# Patient Record
Sex: Male | Born: 1995 | Race: Black or African American | Hispanic: No | Marital: Single | Smoking: Never smoker
Health system: Southern US, Community
[De-identification: ages and names within clinical notes are randomized; demographics above are authoritative.]

## PROBLEM LIST (undated history)

## (undated) ENCOUNTER — Emergency Department (HOSPITAL_COMMUNITY): Admission: EM | Payer: Self-pay | Source: Home / Self Care

## (undated) DIAGNOSIS — A64 Unspecified sexually transmitted disease: Secondary | ICD-10-CM

---
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
## 2014-07-15 ENCOUNTER — Ambulatory Visit (INDEPENDENT_AMBULATORY_CARE_PROVIDER_SITE_OTHER): Payer: BLUE CROSS/BLUE SHIELD | Admitting: Family Medicine

## 2014-07-15 VITALS — BP 120/78 | HR 70 | Temp 98.7°F | Resp 12 | Ht 70.2 in | Wt 136.0 lb

## 2014-07-15 DIAGNOSIS — L209 Atopic dermatitis, unspecified: Secondary | ICD-10-CM

## 2014-07-15 MED ORDER — BETAMETHASONE DIPROPIONATE 0.05 % EX CREA: TOPICAL_CREAM | Freq: Every day | CUTANEOUS | Status: DC

## 2014-07-15 MED ORDER — HYDROXYZINE HCL 25 MG PO TABS: 25.0000 mg | ORAL_TABLET | ORAL | Status: DC | PRN

## 2014-07-15 MED ORDER — TRIAMCINOLONE ACETONIDE 0.1 % EX CREA: 1.0000 "application " | TOPICAL_CREAM | Freq: Two times a day (BID) | CUTANEOUS | Status: DC

## 2014-07-15 NOTE — Progress Notes (Signed)
Subjective:    Patient ID: Derek Warren, male    DOB: Aug 30, 1995, 19 y.o.   MRN: [id number] This chart was scribed for Delman Cheadle, MD by Marti Sleigh, Medical Scribe. This patient was seen in Room 11 and the patient's care was started a 8:44 AM.  Chief Complaint  Patient presents with  . Skin Problem    Tatoo has given problems, started about one month ago w/some itching, pain    HPI HPI Comments: Derek Warren is a 19 y.o. male who presents to [facility] complaining of a rash on his right arm for the last month. Pt states he has reported to [facility] previously for the same sx and was prescribed a steroid cream with relief of his sx. Pt denies having rashes when he was a baby.   History reviewed. No pertinent past medical history. No current outpatient prescriptions on file prior to visit.   No current facility-administered medications on file prior to visit.   No Known Allergies   Review of Systems  Constitutional: Negative for fever, chills, diaphoresis, activity change, appetite change, fatigue and unexpected weight change.  HENT: Negative for rhinorrhea and sneezing.   Respiratory: Negative for cough, chest tightness, shortness of breath and wheezing.   Cardiovascular: Negative for chest pain, palpitations and leg swelling.  Endocrine: Negative for cold intolerance, heat intolerance, polydipsia, polyphagia and polyuria.  Musculoskeletal: Negative for myalgias, arthralgias and gait problem.  Skin: Positive for color change and rash. Negative for pallor and wound.  Allergic/Immunologic: Negative for environmental allergies, food allergies and immunocompromised state.  Hematological: Negative for adenopathy. Does not bruise/bleed easily.  Psychiatric/Behavioral: Positive for sleep disturbance. The patient is nervous/anxious.        Objective:   Physical Exam  Constitutional: He is oriented to person, place, and time. He appears well-developed and well-nourished. No distress.  HENT:    Head: Normocephalic and atraumatic.  Eyes: Pupils are equal, round, and reactive to light.  Neck: Neck supple.  Cardiovascular: Normal rate.   Pulmonary/Chest: Effort normal. No respiratory distress.  Musculoskeletal: Normal range of motion.  Neurological: He is alert and oriented to person, place, and time. Coordination normal.  Skin: Skin is warm and dry. He is not diaphoretic.  Rash with normal skin pigmentaiton forming very faint thin macular striae across flexor surfaces of bilateral elbow with faint surrounding lichenification. Without any induration, fluctuance,scale, tenderness, papular, or erythemic appearance.  Psychiatric: He has a normal mood and affect. His behavior is normal.  Nursing note and vitals reviewed.  BP 120/78 mmHg  Pulse 70  Temp(Src) 98.7 F (37.1 C) (Oral)  Resp 12  Ht 5' 10.2" (1.783 m)  Wt 136 lb (61.689 kg)  BMI 19.40 kg/m2  SpO2 99%     Assessment & Plan:   1. Atopic dermatitis   Pt offered labs for esr/crp/cbc/ana as derm c/s Weber obtained at his last visit noted that this could potentially be a harbinger of lupus or sarcoid. Pt declines this, biopsy, and derm referral for now but will RTC for f/u in about 1 mo w/ me so we proceed if he is continuing to have symptoms. His rash has improved HUGELY from prior - but he is worried that it will progress so returned early - see AVS for detailed instructions.  Spent a long time discussing how to use the creams, the potential adverse reactions and side effects and that we do are not going to be able to predict how his skin pigment and tatoo ink  will respond to the topicals but if any adverse effect d/c immed.  Spent sig time discussing management of this chronic rash over the long term to prevent recurrent flairs.  Meds ordered this encounter  Medications  . triamcinolone cream (KENALOG) 0.1 %    Sig: Apply 1 application topically 2 (two) times daily.    Dispense:  453 g    Refill:  0    Mix in a 1:2  ratio of triamcinolone with a thick white hypoallergenic lotion such as Eucerin/Aquaphor/Cedaphil.  . betamethasone dipropionate (DIPROLENE) 0.05 % cream    Sig: Apply topically daily.    Dispense:  45 g    Refill:  0  . DISCONTD: hydrOXYzine (ATARAX/VISTARIL) 25 MG tablet    Sig: Take 1-2 tablets (25-50 mg total) by mouth every 4 (four) hours as needed for itching (and sleep - will cause drowsiness so use mainly at night).    Dispense:  90 tablet    Refill:  0  . hydrOXYzine (ATARAX/VISTARIL) 25 MG tablet    Sig: Take 1-2 tablets (25-50 mg total) by mouth every 4 (four) hours as needed for itching (and sleep - will cause drowsiness so use for sleep).    Dispense:  90 tablet    Refill:  0   Over 25 min spent in face-to-face evaluation of and consultation with patient and coordination of care.  Over 50% of this time was spent counseling this patient.   I personally performed the services described in this documentation, which was scribed in my presence. The recorded information has been reviewed and considered, and addended by me as needed.  Delman Cheadle, MD MPH

## 2014-07-15 NOTE — Patient Instructions (Signed)
Apply the triamcinolone compound cream to your entire right and left arm immediately after showering once daily (if you don't shower every day just apply it at the same time once a day - less showering is actually better for your skin and remember to use cool water - more frequent showing and hot water will likely make this worse as it dries out the skin so often only showering every 2-3d if you are not getting sweaty or dirty is going to be the best for skin. While the rash is still itching and bothering you, apply the betamethasone into your flexor surfaces of your elbows until those rashes are completely gone.  Hopefully in another several weeks to several months when you are not having any further rashes or problems, you can stop all the prescription steroid creams and switch over to good hypoallergenic thick moisturizer cream such as Eucerin, Cedaphil, or Aquaphor and apply this continually twice a day - especially immediately after showering to prevent it from coming back - and will likely need to do this for the rest of your life.  When everything is gone and you are stopping the creams, here are so other ways that you can hopefully use to ensure that the rash does not come back.  General measures for eczema - In clinical experience, avoidance of irritants or exacerbating factors is beneficial for most patients with dyshidrotic eczema. General skin care measures aimed at reducing skin irritation and restoring the skin barrier include:  Using lukewarm water and soap-free cleansers to wash  Drying skin thoroughly after washing  Applying emollients (eg, petroleum jelly, cocoa butter) immediately after drying and as often as possible  Wearing cotton clothes that breath - especially whever you are getting wet or sweaty  Minimize exposure to metal (jewelry) and any new tatoos/ink until completely resolved Wearing protective barrier clothes in cold weather  Wearing task-specific clothes for frictional  exposures (eg, gardening, carpentry)  Avoiding exposure to irritants (eg, detergents, solvents, hair lotions or dyes, acidic foods [eg, citrus fruit])  Do not use any products that dry your skin.   Eczema Eczema, also called atopic dermatitis, is a skin disorder that causes inflammation of the skin. It causes a red rash and dry, scaly skin. The skin becomes very itchy. Eczema is generally worse during the cooler winter months and often improves with the warmth of summer. Eczema usually starts showing signs in infancy. Some children outgrow eczema, but it may last through adulthood.  CAUSES  The exact cause of eczema is not known, but it appears to run in families. People with eczema often have a family history of eczema, allergies, asthma, or hay fever. Eczema is not contagious. Flare-ups of the condition may be caused by:   Contact with something you are sensitive or allergic to.   Stress. SIGNS AND SYMPTOMS  Dry, scaly skin.   Red, itchy rash.   Itchiness. This may occur before the skin rash and may be very intense.  DIAGNOSIS  The diagnosis of eczema is usually made based on symptoms and medical history. TREATMENT  Eczema cannot be cured, but symptoms usually can be controlled with treatment and other strategies. A treatment plan might include:  Controlling the itching and scratching.   Use over-the-counter antihistamines as directed for itching. This is especially useful at night when the itching tends to be worse.   Use over-the-counter steroid creams as directed for itching.   Avoid scratching. Scratching makes the rash and itching worse. It may  also result in a skin infection (impetigo) due to a break in the skin caused by scratching.   Keeping the skin well moisturized with creams every day. This will seal in moisture and help prevent dryness. Lotions that contain alcohol and water should be avoided because they can dry the skin.   Limiting exposure to things that  you are sensitive or allergic to (allergens).   Recognizing situations that cause stress.   Developing a plan to manage stress.  HOME CARE INSTRUCTIONS   Only take over-the-counter or prescription medicines as directed by your health care provider.   Do not use anything on the skin without checking with your health care provider.   Keep baths or showers short (5 minutes) in warm (not hot) water. Use mild cleansers for bathing. These should be unscented. You may add nonperfumed bath oil to the bath water. It is best to avoid soap and bubble bath.   Immediately after a bath or shower, when the skin is still damp, apply a moisturizing ointment to the entire body. This ointment should be a petroleum ointment. This will seal in moisture and help prevent dryness. The thicker the ointment, the better. These should be unscented.   Keep fingernails cut short. Children with eczema may need to wear soft gloves or mittens at night after applying an ointment.   Dress in clothes made of cotton or cotton blends. Dress lightly, because heat increases itching.   A child with eczema should stay away from anyone with fever blisters or cold sores. The virus that causes fever blisters (herpes simplex) can cause a serious skin infection in children with eczema. SEEK MEDICAL CARE IF:   Your itching interferes with sleep.   Your rash gets worse or is not better within 1 week after starting treatment.   You see pus or soft yellow scabs in the rash area.   You have a fever.   You have a rash flare-up after contact with someone who has fever blisters.  Document Released: 02/15/2000 Document Revised: 12/08/2012 Document Reviewed: 09/20/2012 [facility] CenterExitCare Patient Information 2015 [employer], [place]. This information is not intended to replace advice given to you by your health care provider. Make sure you discuss any questions you have with your health care provider.

## 2014-08-23 ENCOUNTER — Other Ambulatory Visit: Payer: Self-pay | Admitting: Family Medicine

## 2014-08-25 ENCOUNTER — Ambulatory Visit: Payer: BLUE CROSS/BLUE SHIELD | Admitting: Family Medicine

## 2015-02-08 ENCOUNTER — Ambulatory Visit (INDEPENDENT_AMBULATORY_CARE_PROVIDER_SITE_OTHER): Payer: BLUE CROSS/BLUE SHIELD | Admitting: Emergency Medicine

## 2015-02-08 VITALS — BP 134/74 | HR 70 | Temp 98.3°F | Resp 16 | Ht 70.5 in | Wt 141.6 lb

## 2015-02-08 DIAGNOSIS — Z23 Encounter for immunization: Secondary | ICD-10-CM | POA: Diagnosis not present

## 2015-02-08 DIAGNOSIS — Z202 Contact with and (suspected) exposure to infections with a predominantly sexual mode of transmission: Secondary | ICD-10-CM

## 2015-02-08 NOTE — Patient Instructions (Signed)
Sexually Transmitted Disease °A sexually transmitted disease (STD) is a disease or infection that may be passed (transmitted) from person to person, usually during sexual activity. This may happen by way of saliva, semen, blood, vaginal mucus, or urine. Common STDs include: °· Gonorrhea. °· Chlamydia. °· Syphilis. °· HIV and AIDS. °· Genital herpes. °· Hepatitis B and C. °· Trichomonas. °· Human papillomavirus (HPV). °· Pubic lice. °· Scabies. °· Mites. °· Bacterial vaginosis. °WHAT ARE CAUSES OF STDs? °An STD may be caused by bacteria, a virus, or parasites. STDs are often transmitted during sexual activity if one person is infected. However, they may also be transmitted through nonsexual means. STDs may be transmitted after:  °· Sexual intercourse with an infected person. °· Sharing sex toys with an infected person. °· Sharing needles with an infected person or using unclean piercing or tattoo needles. °· Having intimate contact with the genitals, mouth, or rectal areas of an infected person. °· Exposure to infected fluids during birth. °WHAT ARE THE SIGNS AND SYMPTOMS OF STDs? °Different STDs have different symptoms. Some people may not have any symptoms. If symptoms are present, they may include: °· Painful or bloody urination. °· Pain in the pelvis, abdomen, vagina, anus, throat, or eyes. °· A skin rash, itching, or irritation. °· Growths, ulcerations, blisters, or sores in the genital and anal areas. °· Abnormal vaginal discharge with or without bad odor. °· Penile discharge in men. °· Fever. °· Pain or bleeding during sexual intercourse. °· Swollen glands in the groin area. °· Yellow skin and eyes (jaundice). This is seen with hepatitis. °· Swollen testicles. °· Infertility. °· Sores and blisters in the mouth. °HOW ARE STDs DIAGNOSED? °To make a diagnosis, your health care provider may: °· Take a medical history. °· Perform a physical exam. °· Take a sample of any discharge to examine. °· Swab the throat,  cervix, opening to the penis, rectum, or vagina for testing. °· Test a sample of your first morning urine. °· Perform blood tests. °· Perform a Pap test, if this applies. °· Perform a colposcopy. °· Perform a laparoscopy. °HOW ARE STDs TREATED? °Treatment depends on the STD. Some STDs may be treated but not cured. °· Chlamydia, gonorrhea, trichomonas, and syphilis can be cured with antibiotic medicine. °· Genital herpes, hepatitis, and HIV can be treated, but not cured, with prescribed medicines. The medicines lessen symptoms. °· Genital warts from HPV can be treated with medicine or by freezing, burning (electrocautery), or surgery. Warts may come back. °· HPV cannot be cured with medicine or surgery. However, abnormal areas may be removed from the cervix, vagina, or vulva. °· If your diagnosis is confirmed, your recent sexual partners need treatment. This is true even if they are symptom-free or have a negative culture or evaluation. They should not have sex until their health care providers say it is okay. °· Your health care provider may test you for infection again 3 months after treatment. °HOW CAN I REDUCE MY RISK OF GETTING AN STD? °Take these steps to reduce your risk of getting an STD: °· Use latex condoms, dental dams, and water-soluble lubricants during sexual activity. Do not use petroleum jelly or oils. °· Avoid having multiple sex partners. °· Do not have sex with someone who has other sex partners °· Do not have sex with anyone you do not know or who is at high risk for an STD. °· Avoid risky sex practices that can break your skin. °· Do not have sex   if you have open sores on your mouth or skin. °· Avoid drinking too much alcohol or taking illegal drugs. Alcohol and drugs can affect your judgment and put you in a vulnerable position. °· Avoid engaging in oral and anal sex acts. °· Get vaccinated for HPV and hepatitis. If you have not received these vaccines in the past, talk to your health care  provider about whether one or both might be right for you. °· If you are at risk of being infected with HIV, it is recommended that you take a prescription medicine daily to prevent HIV infection. This is called pre-exposure prophylaxis (PrEP). You are considered at risk if: °¨ You are a man who has sex with other men (MSM). °¨ You are a heterosexual man or woman and are sexually active with more than one partner. °¨ You take drugs by injection. °¨ You are sexually active with a partner who has HIV. °· Talk with your health care provider about whether you are at high risk of being infected with HIV. If you choose to begin PrEP, you should first be tested for HIV. You should then be tested every 3 months for as long as you are taking PrEP. °WHAT SHOULD I DO IF I THINK I HAVE AN STD? °· See your health care provider. °· Tell your sexual partner(s). They should be tested and treated for any STDs. °· Do not have sex until your health care provider says it is okay. °WHEN SHOULD I GET IMMEDIATE MEDICAL CARE? °Contact your health care provider right away if:  °· You have severe abdominal pain. °· You are a man and notice swelling or pain in your testicles. °· You are a woman and notice swelling or pain in your vagina. °  °This information is not intended to replace advice given to you by your health care provider. Make sure you discuss any questions you have with your health care provider. °  °Document Released: 05/10/2002 Document Revised: 03/10/2014 Document Reviewed: 09/07/2012 °Elsevier Interactive Patient Education ©2016 [employer] ° °

## 2015-02-08 NOTE — Progress Notes (Signed)
Subjective:  Patient ID: Derek Warren, male    DOB: September 19, 1995  Age: 19 y.o. MRN: [id number]  CC: Std check and Immunizations   HPI Derek Warren presents   Patient is concerned the baby has been exposed to STD has a history of prior STDs he's not symptomatic currently has no discharge or rash blisters. He denies any pain. He requests a flu shot  History Derek Warren has no past medical history on file.   He has no past surgical history on file.   His  family history includes Cancer in his other.  He   reports that he has never smoked. He has never used smokeless tobacco. He reports that he does not drink alcohol or use illicit drugs.  Outpatient Prescriptions Prior to Visit  Medication Sig Dispense Refill  . betamethasone dipropionate (DIPROLENE) 0.05 % cream Apply topically daily. (Patient not taking: Reported on 02/08/2015) 45 g 0  . hydrOXYzine (ATARAX/VISTARIL) 25 MG tablet Take 1-2 tablets (25-50 mg total) by mouth every 4 (four) hours as needed for itching (and sleep - will cause drowsiness so use for sleep). (Patient not taking: Reported on 02/08/2015) 90 tablet 0  . triamcinolone cream (KENALOG) 0.1 % Apply 1 application topically 2 (two) times daily. (Patient not taking: Reported on 02/08/2015) 453 g 0   No facility-administered medications prior to visit.    Social History   Social History  . Marital Status: Single    Spouse Name: N/A  . Number of Children: N/A  . Years of Education: N/A   Social History Main Topics  . Smoking status: Never Smoker   . Smokeless tobacco: Never Used  . Alcohol Use: No  . Drug Use: No  . Sexual Activity: Not Asked   Other Topics Concern  . None   Social History Narrative     Review of Systems  Constitutional: Negative for fever, chills and appetite change.  HENT: Negative for congestion, ear pain, postnasal drip, sinus pressure and sore throat.   Eyes: Negative for pain and redness.  Respiratory: Negative for cough,  shortness of breath and wheezing.   Cardiovascular: Negative for leg swelling.  Gastrointestinal: Negative for nausea, vomiting, abdominal pain, diarrhea, constipation and blood in stool.  Endocrine: Negative for polyuria.  Genitourinary: Negative for dysuria, urgency, frequency and flank pain.  Musculoskeletal: Negative for gait problem.  Skin: Negative for rash.  Neurological: Negative for weakness and headaches.  Psychiatric/Behavioral: Negative for confusion and decreased concentration. The patient is not nervous/anxious.     Objective:  BP 134/74 mmHg  Pulse 70  Temp(Src) 98.3 F (36.8 C) (Oral)  Resp 16  Ht 5' 10.5" (1.791 m)  Wt 141 lb 9.6 oz (64.229 kg)  BMI 20.02 kg/m2  SpO2 99%  Physical Exam  Constitutional: He is oriented to person, place, and time. He appears well-developed and well-nourished. No distress.  HENT:  Head: Normocephalic and atraumatic.  Right Ear: External ear normal.  Left Ear: External ear normal.  Nose: Nose normal.  Eyes: Conjunctivae and EOM are normal. Pupils are equal, round, and reactive to light. No scleral icterus.  Neck: Normal range of motion. Neck supple. No tracheal deviation present.  Cardiovascular: Normal rate, regular rhythm and normal heart sounds.   Pulmonary/Chest: Effort normal. No respiratory distress. He has no wheezes. He has no rales.  Abdominal: He exhibits no mass. There is no tenderness. There is no rebound and no guarding.  Musculoskeletal: He exhibits no edema.  Lymphadenopathy:    He has  no cervical adenopathy.  Neurological: He is alert and oriented to person, place, and time. Coordination normal.  Skin: Skin is warm and dry. No rash noted.  Psychiatric: He has a normal mood and affect. His behavior is normal.      Assessment & Plan:   Derek Warren was seen today for std check and immunizations.  Diagnoses and all orders for this visit:  STD exposure -     Cancel: Epstein-Barr virus VCA antibody panel -      HSV(herpes simplex vrs) 1+2 ab-IgG -     HIV antibody -     RPR -     GC/Chlamydia Probe Amp  Needs flu shot -     Flu Vaccine QUAD 36+ mos IM   I am having Mr. Ladona RidgelRatliff maintain his triamcinolone cream, betamethasone dipropionate, and hydrOXYzine.  No orders of the defined types were placed in this encounter.    Appropriate red flag conditions were discussed with the patient as well as actions that should be taken.  Patient expressed his understanding.  Follow-up: Return if symptoms worsen or fail to improve.  Carmelina DaneAnderson, Breonna Gafford S, MD

## 2015-02-09 LAB — GC/CHLAMYDIA PROBE AMP
CT Probe RNA: NOT DETECTED
GC Probe RNA: NOT DETECTED

## 2015-02-09 LAB — HSV(HERPES SIMPLEX VRS) I + II AB-IGG
HSV 1 GLYCOPROTEIN G AB, IGG: 9.51 IV — AB
HSV 2 Glycoprotein G Ab, IgG: <0.1 IV

## 2015-02-09 LAB — HIV ANTIBODY (ROUTINE TESTING W REFLEX): HIV: NONREACTIVE

## 2015-02-09 LAB — RPR

## 2015-03-14 ENCOUNTER — Ambulatory Visit: Payer: BLUE CROSS/BLUE SHIELD

## 2015-03-28 ENCOUNTER — Ambulatory Visit: Payer: BLUE CROSS/BLUE SHIELD

## 2015-03-29 ENCOUNTER — Ambulatory Visit (INDEPENDENT_AMBULATORY_CARE_PROVIDER_SITE_OTHER): Payer: BC Managed Care – PPO | Admitting: Urgent Care

## 2015-03-29 VITALS — BP 118/68 | HR 82 | Temp 98.9°F | Ht 70.5 in | Wt 140.0 lb

## 2015-03-29 DIAGNOSIS — L309 Dermatitis, unspecified: Secondary | ICD-10-CM | POA: Diagnosis not present

## 2015-03-29 DIAGNOSIS — Z7189 Other specified counseling: Secondary | ICD-10-CM | POA: Diagnosis not present

## 2015-03-29 DIAGNOSIS — Z202 Contact with and (suspected) exposure to infections with a predominantly sexual mode of transmission: Secondary | ICD-10-CM | POA: Diagnosis not present

## 2015-03-29 DIAGNOSIS — Z712 Person consulting for explanation of examination or test findings: Secondary | ICD-10-CM

## 2015-03-29 NOTE — Patient Instructions (Addendum)
- Use Dove for sensitive skin for bathing - Use Lubriderm for moisturizing your skin daily - If you get eczema again, use betamethasone (Diprolene) twice daily for 1 week with Zyrtec, hydroxyzine for itching.   Eczema Eczema, also called atopic dermatitis, is a skin disorder that causes inflammation of the skin. It causes a red rash and dry, scaly skin. The skin becomes very itchy. Eczema is generally worse during the cooler winter months and often improves with the warmth of summer. Eczema usually starts showing signs in infancy. Some children outgrow eczema, but it may last through adulthood.  CAUSES  The exact cause of eczema is not known, but it appears to run in families. People with eczema often have a family history of eczema, allergies, asthma, or hay fever. Eczema is not contagious. Flare-ups of the condition may be caused by:   Contact with something you are sensitive or allergic to.   Stress. SIGNS AND SYMPTOMS  Dry, scaly skin.   Red, itchy rash.   Itchiness. This may occur before the skin rash and may be very intense.  DIAGNOSIS  The diagnosis of eczema is usually made based on symptoms and medical history. TREATMENT  Eczema cannot be cured, but symptoms usually can be controlled with treatment and other strategies. A treatment plan might include:  Controlling the itching and scratching.   Use over-the-counter antihistamines as directed for itching. This is especially useful at night when the itching tends to be worse.   Use over-the-counter steroid creams as directed for itching.   Avoid scratching. Scratching makes the rash and itching worse. It may also result in a skin infection (impetigo) due to a break in the skin caused by scratching.   Keeping the skin well moisturized with creams every day. This will seal in moisture and help prevent dryness. Lotions that contain alcohol and water should be avoided because they can dry the skin.   Limiting exposure  to things that you are sensitive or allergic to (allergens).   Recognizing situations that cause stress.   Developing a plan to manage stress.  HOME CARE INSTRUCTIONS   Only take over-the-counter or prescription medicines as directed by your health care provider.   Do not use anything on the skin without checking with your health care provider.   Keep baths or showers short (5 minutes) in warm (not hot) water. Use mild cleansers for bathing. These should be unscented. You may add nonperfumed bath oil to the bath water. It is best to avoid soap and bubble bath.   Immediately after a bath or shower, when the skin is still damp, apply a moisturizing ointment to the entire body. This ointment should be a petroleum ointment. This will seal in moisture and help prevent dryness. The thicker the ointment, the better. These should be unscented.   Keep fingernails cut short. Children with eczema may need to wear soft gloves or mittens at night after applying an ointment.   Dress in clothes made of cotton or cotton blends. Dress lightly, because heat increases itching.   A child with eczema should stay away from anyone with fever blisters or cold sores. The virus that causes fever blisters (herpes simplex) can cause a serious skin infection in children with eczema. SEEK MEDICAL CARE IF:   Your itching interferes with sleep.   Your rash gets worse or is not better within 1 week after starting treatment.   You see pus or soft yellow scabs in the rash area.  You have a fever.   You have a rash flare-up after contact with someone who has fever blisters.    This information is not intended to replace advice given to you by your health care provider. Make sure you discuss any questions you have with your health care provider.   Document Released: 02/15/2000 Document Revised: 12/08/2012 Document Reviewed: 09/20/2012 Elsevier Interactive Patient Education 2016 [employer].   Safe  Sex Safe sex is about reducing the risk of giving or getting a sexually transmitted disease (STD). STDs are spread through sexual contact involving the genitals, mouth, or rectum. Some STDs can be cured and others cannot. Safe sex can also prevent unintended pregnancies.  WHAT ARE SOME SAFE SEX PRACTICES?  Limit your sexual activity to only one partner who is having sex with only you.  Talk to your partner about his or her past partners, past STDs, and drug use.  Use a condom every time you have sexual intercourse. This includes vaginal, oral, and anal sexual activity. Both females and males should wear condoms during oral sex. Only use latex or polyurethane condoms and water-based lubricants. Using petroleum-based lubricants or oils to lubricate a condom will weaken the condom and increase the chance that it will break. The condom should be in place from the beginning to the end of sexual activity. Wearing a condom reduces, but does not completely eliminate, your risk of getting or giving an STD. STDs can be spread by contact with infected body fluids and skin.  Get vaccinated for hepatitis B and HPV.  Avoid alcohol and recreational drugs, which can affect your judgment. You may forget to use a condom or participate in high-risk sex.  For females, avoid douching after sexual intercourse. Douching can spread an infection farther into the reproductive tract.  Check your body for signs of sores, blisters, rashes, or unusual discharge. See your health care provider if you notice any of these signs.  Avoid sexual contact if you have symptoms of an infection or are being treated for an STD. If you or your partner has herpes, avoid sexual contact when blisters are present. Use condoms at all other times.  If you are at risk of being infected with HIV, it is recommended that you take a prescription medicine daily to prevent HIV infection. This is called pre-exposure prophylaxis (PrEP). You are  considered at risk if:  You are a man who has sex with other men (MSM).  You are a heterosexual man or woman who is sexually active with more than one partner.  You take drugs by injection.  You are sexually active with a partner who has HIV.  Talk with your health care provider about whether you are at high risk of being infected with HIV. If you choose to begin PrEP, you should first be tested for HIV. You should then be tested every 3 months for as long as you are taking PrEP.  See your health care provider for regular screenings, exams, and tests for other STDs. Before having sex with a new partner, each of you should be screened for STDs and should talk about the results with each other. WHAT ARE THE BENEFITS OF SAFE SEX?   There is less chance of getting or giving an STD.  You can prevent unwanted or unintended pregnancies.  By discussing safe sex concerns with your partner, you may increase feelings of intimacy, comfort, trust, and honesty between the two of you.   This information is not intended to  replace advice given to you by your health care provider. Make sure you discuss any questions you have with your health care provider.   Document Released: 03/27/2004 Document Revised: 03/10/2014 Document Reviewed: 08/11/2011 Elsevier Interactive Patient Education [employer].

## 2015-03-29 NOTE — Progress Notes (Signed)
    MRN: [id number] DOB: Nov 17, 1995  Subjective:   Derek Warren is a 20 y.o. male presenting for chief complaint of Dysuria and Follow-up  STI exposure - patient was worried that he had gonorrhea and chlamydia from a bill he received for this testing. He denies dysuria, penile discharge, genital rash. He never received results letter due to incorrect address.  Atopic Dermatitis - has a history of eczema which is improved with steroid cream. Patient is worried that this is still an ongoing problem. He denies rash, redness or itchiness.  Derek Warren has a current medication list which includes the following prescription(s): betamethasone dipropionate. Also has No Known Allergies.  Derek Warren  has no past medical history on file. Also  has no past surgical history on file.  Objective:   Vitals: BP 118/68 mmHg  Pulse 82  Temp(Src) 98.9 F (37.2 C) (Oral)  Ht 5' 10.5" (1.791 m)  Wt 140 lb (63.504 kg)  BMI 19.80 kg/m2  SpO2 98%  Physical Exam  Constitutional: He is oriented to person, place, and time. He appears well-developed and well-nourished.  Cardiovascular: Normal rate.   Pulmonary/Chest: Effort normal.  Neurological: He is alert and oriented to person, place, and time.  Skin: Skin is warm and dry. No rash noted. No erythema. No pallor.  Patient has large tattoo covering 50-75% of right forearm.   Assessment and Plan :   1. Encounter to discuss test results 2. STD exposure - Reviewed results, recommended safe sex practices.  3. Eczema - Counseled on diagnosis, recommended daily moisturizing. Use Diprolene if eczema returns, also use oral antihistamine.  Wallis Bamberg, PA-C [facility] [phone] 03/29/2015 8:32 AM

## 2018-03-03 ENCOUNTER — Emergency Department (HOSPITAL_COMMUNITY)
Admission: EM | Admit: 2018-03-03 | Discharge: 2018-03-03 | Disposition: A | Discharge status: Home / Self Care | Payer: BC Managed Care – PPO | Attending: Emergency Medicine | Admitting: Emergency Medicine

## 2018-03-03 ENCOUNTER — Encounter (HOSPITAL_COMMUNITY): Payer: Self-pay | Admitting: Emergency Medicine

## 2018-03-03 ENCOUNTER — Other Ambulatory Visit: Payer: Self-pay

## 2018-03-03 DIAGNOSIS — R1110 Vomiting, unspecified: Secondary | ICD-10-CM | POA: Diagnosis not present

## 2018-03-03 DIAGNOSIS — D72819 Decreased white blood cell count, unspecified: Secondary | ICD-10-CM | POA: Diagnosis not present

## 2018-03-03 DIAGNOSIS — R509 Fever, unspecified: Secondary | ICD-10-CM | POA: Diagnosis present

## 2018-03-03 LAB — CBC
HEMATOCRIT: 44 % (ref 39.0–52.0)
HEMOGLOBIN: 14.5 g/dL (ref 13.0–17.0)
MCH: 32.7 pg (ref 26.0–34.0)
MCHC: 33 g/dL (ref 30.0–36.0)
MCV: 99.1 fL (ref 80.0–100.0)
NRBC: 0 % (ref 0.0–0.2)
Platelets: 275 10*3/uL (ref 150–400)
RBC: 4.44 MIL/uL (ref 4.22–5.81)
RDW: 11.6 % (ref 11.5–15.5)
WBC: 3.9 10*3/uL — AB (ref 4.0–10.5)

## 2018-03-03 LAB — COMPREHENSIVE METABOLIC PANEL
ALT: 6 U/L (ref 0–44)
AST: 24 U/L (ref 15–41)
Albumin: 4.3 g/dL (ref 3.5–5.0)
Alkaline Phosphatase: 60 U/L (ref 38–126)
Anion gap: 12 (ref 5–15)
BUN: 10 mg/dL (ref 6–20)
CO2: 24 mmol/L (ref 22–32)
Calcium: 9.2 mg/dL (ref 8.9–10.3)
Chloride: 101 mmol/L (ref 98–111)
Creatinine, Ser: 0.98 mg/dL (ref 0.61–1.24)
GFR calc Af Amer: >60 mL/min (ref >60)
GFR calc non Af Amer: >60 mL/min (ref >60)
Glucose, Bld: 103 mg/dL — ABNORMAL HIGH (ref 70–99)
POTASSIUM: 3.7 mmol/L (ref 3.5–5.1)
SODIUM: 137 mmol/L (ref 135–145)
Total Bilirubin: 0.9 mg/dL (ref 0.3–1.2)
Total Protein: 7.8 g/dL (ref 6.5–8.1)

## 2018-03-03 LAB — LIPASE, BLOOD: Lipase: 26 U/L (ref 11–51)

## 2018-03-03 MED ORDER — ONDANSETRON 4 MG PO TBDP: 4.0000 mg | ORAL_TABLET | Freq: Three times a day (TID) | ORAL | 0 refills | Status: DC | PRN

## 2018-03-03 NOTE — ED Provider Notes (Addendum)
[facility] EMERGENCY DEPARTMENT Provider Note   [name]: [id number] Arrival date & time: 03/03/18  1928     History   Chief Complaint Chief Complaint  Patient presents with  . Abdominal Pain    HPI Derek Warren is a 23 y.o. male.  23 yo M with a chief complaint of diffuse myalgias subjective fevers and chills and vomiting.  Going on for the past week or so.  Is vomited about 2 times in total.  Denies abdominal pain denies sick contacts.  Has had a mild cough as well.  The history is provided by the patient.  Abdominal Pain   Pertinent negatives include fever, diarrhea, vomiting, headaches, arthralgias and myalgias.  Illness  This is a new problem. The current episode started more than 1 week ago. The problem occurs constantly. The problem has been gradually improving. Associated symptoms include abdominal pain. Pertinent negatives include no chest pain, no headaches and no shortness of breath. Nothing aggravates the symptoms. Nothing relieves the symptoms. He has tried nothing for the symptoms. The treatment provided no relief.    History reviewed. No pertinent past medical history.  There are no active problems to display for this patient.   History reviewed. No pertinent surgical history.      Home Medications    Prior to Admission medications   Medication Sig Start Date End Date Taking? Authorizing Provider  betamethasone dipropionate (DIPROLENE) 0.05 % cream Apply 1 application topically 2 (two) times daily.    [provider]  ondansetron (ZOFRAN ODT) 4 MG disintegrating tablet Take 1 tablet (4 mg total) by mouth every 8 (eight) hours as needed for nausea or vomiting. 03/03/18   Melene Plan, DO    Family History Family History  Problem Relation Age of Onset  . Cancer Other     Social History Social History   Tobacco Use  . Smoking status: Never Smoker  . Smokeless tobacco: Never Used  Substance Use Topics  . Alcohol use: No   Alcohol/week: 0.0 standard drinks  . Drug use: No     Allergies   Patient has no known allergies.   Review of Systems Review of Systems  Constitutional: Negative for chills and fever.  HENT: Negative for congestion and facial swelling.   Eyes: Negative for discharge and visual disturbance.  Respiratory: Negative for shortness of breath.   Cardiovascular: Negative for chest pain and palpitations.  Gastrointestinal: Positive for abdominal pain. Negative for diarrhea and vomiting.  Musculoskeletal: Negative for arthralgias and myalgias.  Skin: Negative for color change and rash.  Neurological: Negative for tremors, syncope and headaches.  Psychiatric/Behavioral: Negative for confusion and dysphoric mood.     Physical Exam Updated Vital Signs BP 127/81   Pulse 62   Temp 98.2 F (36.8 C) (Oral)   Resp 16   Ht 5\' 11"  (1.803 m)   Wt 63.5 kg   SpO2 100%   BMI 19.53 kg/m   Physical Exam Vitals signs and nursing note reviewed.  Constitutional:      Appearance: He is well-developed.  HENT:     Head: Normocephalic and atraumatic.  Eyes:     Pupils: Pupils are equal, round, and reactive to light.  Neck:     Musculoskeletal: Normal range of motion and neck supple.     Vascular: No JVD.  Cardiovascular:     Rate and Rhythm: Normal rate and regular rhythm.     Heart sounds: No murmur. No friction rub. No gallop.  Pulmonary:     Effort: No respiratory distress.     Breath sounds: No wheezing.  Abdominal:     General: There is no distension.     Tenderness: There is abdominal tenderness (mild, diffuse). There is no guarding or rebound.  Musculoskeletal: Normal range of motion.  Skin:    Coloration: Skin is not pale.     Findings: No rash.  Neurological:     Mental Status: He is alert and oriented to person, place, and time.  Psychiatric:        Behavior: Behavior normal.      ED Treatments / Results  Labs (all labs ordered are listed, but only abnormal results  are displayed) Labs Reviewed  COMPREHENSIVE METABOLIC PANEL - Abnormal; Notable for the following components:      Result Value   Glucose, Bld 103 (*)    All other components within normal limits  CBC - Abnormal; Notable for the following components:   WBC 3.9 (*)    All other components within normal limits  LIPASE, BLOOD  URINALYSIS, ROUTINE W REFLEX MICROSCOPIC    EKG None  Radiology No results found.  Procedures Procedures (including critical care time)  Medications Ordered in ED Medications - No data to display   Initial Impression / Assessment and Plan / ED Course  I have reviewed the triage vital signs and the nursing notes.  Pertinent labs & imaging results that were available during my care of the patient were reviewed by me and considered in my medical decision making (see chart for details).     23 yo M with cough myalgias couple episodes of vomiting and subjective fevers and chills.  Is likely this is a viral syndrome.  He had lab work performed with no LFT elevation normal lipase very minimally elevated blood sugar at 103.  He does have leukopenia.  Most likely result of the viral illness, suggested he follow this up with his PCP.  9:41 PM:  I have discussed the diagnosis/risks/treatment options with the patient and family and believe the pt to be eligible for discharge home to follow-up with PCP. We also discussed returning to the ED immediately if new or worsening sx occur. We discussed the sx which are most concerning (e.g., sudden worsening pain, fever, inability to tolerate by mouth) that necessitate immediate return. Medications administered to the patient during their visit and any new prescriptions provided to the patient are listed below.  Medications given during this visit Medications - No data to display    The patient appears reasonably screen and/or stabilized for discharge and I doubt any other medical condition or other [facility] requiring further  screening, evaluation, or treatment in the ED at this time prior to discharge.    Final Clinical Impressions(s) / ED Diagnoses   Final diagnoses:  Vomiting in adult  Leukopenia, unspecified type    ED Discharge Orders         Ordered    ondansetron (ZOFRAN ODT) 4 MG disintegrating tablet  Every 8 hours PRN     03/03/18 2139           Melene PlanFloyd, Minka Knight, DO 03/03/18 2142    Melene PlanFloyd, Samin Milke, DO 03/15/18 1042

## 2018-03-03 NOTE — ED Triage Notes (Signed)
Pt reports abd pain, lightheadedness and generalized body aches for the last week. Pt reports N/V. Denies diarrhea. Pt reports taking otc medications like Tylenol and Mucinex with some relief.

## 2018-03-03 NOTE — ED Notes (Signed)
Patient verbalizes understanding of discharge instructions. Opportunity for questioning and answers were provided. Armband removed by staff, pt discharged from ED. Pt ambulatory to lobby with friend.  

## 2018-03-03 NOTE — Discharge Instructions (Signed)
Return for fever, abdominal pain, inability to eat or drink.

## 2018-07-21 ENCOUNTER — Other Ambulatory Visit: Payer: Self-pay

## 2018-07-21 ENCOUNTER — Emergency Department (HOSPITAL_COMMUNITY)
Admission: EM | Admit: 2018-07-21 | Discharge: 2018-07-21 | Disposition: A | Discharge status: Home / Self Care | Payer: BC Managed Care – PPO | Attending: Emergency Medicine | Admitting: Emergency Medicine

## 2018-07-21 ENCOUNTER — Encounter (HOSPITAL_COMMUNITY): Payer: Self-pay | Admitting: Emergency Medicine

## 2018-07-21 DIAGNOSIS — R350 Frequency of micturition: Secondary | ICD-10-CM | POA: Insufficient documentation

## 2018-07-21 LAB — CBC WITH DIFFERENTIAL/PLATELET
Abs Immature Granulocytes: 0.01 10*3/uL (ref 0.00–0.07)
Basophils Absolute: 0.1 10*3/uL (ref 0.0–0.1)
Basophils Relative: 1 %
Eosinophils Absolute: 0.1 10*3/uL (ref 0.0–0.5)
Eosinophils Relative: 2 %
HCT: 39.7 % (ref 39.0–52.0)
Hemoglobin: 13.5 g/dL (ref 13.0–17.0)
Immature Granulocytes: 0 %
Lymphocytes Relative: 55 %
Lymphs Abs: 2.9 10*3/uL (ref 0.7–4.0)
MCH: 34.1 pg — ABNORMAL HIGH (ref 26.0–34.0)
MCHC: 34 g/dL (ref 30.0–36.0)
MCV: 100.3 fL — ABNORMAL HIGH (ref 80.0–100.0)
Monocytes Absolute: 0.5 10*3/uL (ref 0.1–1.0)
Monocytes Relative: 9 %
Neutro Abs: 1.8 10*3/uL (ref 1.7–7.7)
Neutrophils Relative %: 33 %
Platelets: 279 10*3/uL (ref 150–400)
RBC: 3.96 MIL/uL — ABNORMAL LOW (ref 4.22–5.81)
RDW: 11.6 % (ref 11.5–15.5)
WBC: 5.3 10*3/uL (ref 4.0–10.5)
nRBC: 0 % (ref 0.0–0.2)

## 2018-07-21 LAB — URINALYSIS, ROUTINE W REFLEX MICROSCOPIC
Bilirubin Urine: NEGATIVE
Glucose, UA: NEGATIVE mg/dL
Hgb urine dipstick: NEGATIVE
Ketones, ur: NEGATIVE mg/dL
Leukocytes,Ua: NEGATIVE
Nitrite: NEGATIVE
Protein, ur: NEGATIVE mg/dL
Specific Gravity, Urine: 1.027 (ref 1.005–1.030)
pH: 5 (ref 5.0–8.0)

## 2018-07-21 NOTE — ED Provider Notes (Signed)
[facility]   [name]: [id number] Arrival date & time: 07/21/18  2009    History   Chief Complaint Chief Complaint  Patient presents with  . Urinary Frequency    HPI Derek Warren is a 23 y.o. male.    23 year old male presents with urinary frequency.  Patient states he has urinary frequency because he drinks a lot of Gatorade.  He has here today requesting a work note to say he can go to the bathroom more than twice a shift.  Patient denies any other complaints, dysuria, abdominal pain, fevers.  Urinary Frequency  Pertinent negatives include no chest pain, no abdominal pain and no shortness of breath.    History reviewed. No pertinent past medical history.  There are no active problems to display for this patient.   History reviewed. No pertinent surgical history.      Home Medications    Prior to Admission medications   Medication Sig Start Date End Date Taking? Authorizing Provider  betamethasone dipropionate (DIPROLENE) 0.05 % cream Apply 1 application topically 2 (two) times daily.    [provider]  ondansetron (ZOFRAN ODT) 4 MG disintegrating tablet Take 1 tablet (4 mg total) by mouth every 8 (eight) hours as needed for nausea or vomiting. 03/03/18   Melene PlanFloyd, Dan, DO    Family History Family History  Problem Relation Age of Onset  . Cancer Other     Social History Social History   Tobacco Use  . Smoking status: Never Smoker  . Smokeless tobacco: Never Used  Substance Use Topics  . Alcohol use: No    Alcohol/week: 0.0 standard drinks  . Drug use: Never     Allergies   Patient has no known allergies.   Review of Systems Review of Systems  Constitutional: Negative for chills and fever.  Respiratory: Negative for shortness of breath.   Cardiovascular: Negative for chest pain.  Gastrointestinal: Negative for abdominal pain.  Genitourinary: Positive for frequency. Negative for discharge,  dysuria, penile pain and penile swelling.     Physical Exam Updated Vital Signs BP 122/73 (BP Location: Right Arm)   Pulse 76   Temp 98.6 F (37 C) (Oral)   Resp 16   Ht 5\' 11"  (1.803 m)   Wt 65.8 kg   SpO2 98%   BMI 20.22 kg/m   Physical Exam Vitals signs and nursing note reviewed.  Constitutional:      Appearance: He is well-developed.  HENT:     Head: Normocephalic and atraumatic.  Eyes:     Conjunctiva/sclera: Conjunctivae normal.  Neck:     Musculoskeletal: Neck supple.  Cardiovascular:     Rate and Rhythm: Normal rate and regular rhythm.     Heart sounds: Normal heart sounds. No murmur.  Pulmonary:     Effort: Pulmonary effort is normal. No respiratory distress.     Breath sounds: Normal breath sounds. No wheezing or rales.  Abdominal:     General: Bowel sounds are normal. There is no distension.     Palpations: Abdomen is soft.     Tenderness: There is no abdominal tenderness.  Musculoskeletal: Normal range of motion.        General: No tenderness or deformity.  Skin:    General: Skin is warm and dry.     Findings: No erythema or rash.  Neurological:     Mental Status: He is alert and oriented to person, place, and time.  Psychiatric:  Behavior: Behavior normal.      ED Treatments / Results  Labs (all labs ordered are listed, but only abnormal results are displayed) Labs Reviewed  CBC WITH DIFFERENTIAL/PLATELET - Abnormal; Notable for the following components:      Result Value   RBC 3.96 (*)    MCV 100.3 (*)    MCH 34.1 (*)    All other components within normal limits  URINALYSIS, ROUTINE W REFLEX MICROSCOPIC    EKG None  Radiology No results found.  Procedures Procedures (including critical care time)  Medications Ordered in ED Medications - No data to display   Initial Impression / Assessment and Plan / ED Course  I have reviewed the triage vital signs and the nursing notes.  Pertinent labs & imaging results that were  available during my care of the patient were reviewed by me and considered in my medical decision making (see chart for details).        Patient complaining of urinary frequency which he states is due to drinking a lot of Gatorade.  He is here today requesting a work note saying he may use bathroom more than twice in a shift.  His vital signs are stable.  His physical exam is unremarkable.  A CBC was ordered in triage however do not feel that this is indicated at this time.  CBC was unremarkable.  Urine shows no evidence of UTI.  No emergent medical condition identified at this time.  Patient ready and stable for discharge.  Final Clinical Impressions(s) / ED Diagnoses   Final diagnoses:  None    ED Discharge Orders    None       Rueben Bash 07/21/18 2220    Arby Barrette, MD 07/28/18 [phone]

## 2018-07-21 NOTE — ED Triage Notes (Signed)
Pt to ED for urinary frequency for 2 months

## 2018-07-21 NOTE — Discharge Instructions (Signed)
Continue to drink plenty of fluids.  Return to the ER for any new or worsening symptoms, such as fevers, abdominal pain, chest pain or any concerns at all.

## 2019-01-18 ENCOUNTER — Emergency Department (HOSPITAL_BASED_OUTPATIENT_CLINIC_OR_DEPARTMENT_OTHER)
Admission: EM | Admit: 2019-01-18 | Discharge: 2019-01-18 | Disposition: A | Discharge status: Home / Self Care | Payer: BC Managed Care – PPO | Attending: Emergency Medicine | Admitting: Emergency Medicine

## 2019-01-18 ENCOUNTER — Other Ambulatory Visit: Payer: Self-pay

## 2019-01-18 ENCOUNTER — Encounter (HOSPITAL_BASED_OUTPATIENT_CLINIC_OR_DEPARTMENT_OTHER): Payer: Self-pay | Admitting: *Deleted

## 2019-01-18 DIAGNOSIS — R369 Urethral discharge, unspecified: Secondary | ICD-10-CM | POA: Insufficient documentation

## 2019-01-18 DIAGNOSIS — Z202 Contact with and (suspected) exposure to infections with a predominantly sexual mode of transmission: Secondary | ICD-10-CM | POA: Insufficient documentation

## 2019-01-18 DIAGNOSIS — Z7689 Persons encountering health services in other specified circumstances: Secondary | ICD-10-CM

## 2019-01-18 MED ORDER — LIDOCAINE HCL (PF) 1 % IJ SOLN
INTRAMUSCULAR | Status: AC
  Administered 2019-01-18: 14:00:00 1.2 mL
  Filled 2019-01-18: qty 5

## 2019-01-18 MED ORDER — CEFTRIAXONE SODIUM 250 MG IJ SOLR
250.0000 mg | Freq: Once | INTRAMUSCULAR | Status: AC
  Administered 2019-01-18: 250 mg via INTRAMUSCULAR
  Filled 2019-01-18: qty 250

## 2019-01-18 MED ORDER — AZITHROMYCIN 250 MG PO TABS
1000.0000 mg | ORAL_TABLET | Freq: Once | ORAL | Status: AC
  Administered 2019-01-18: 1000 mg via ORAL
  Filled 2019-01-18: qty 4

## 2019-01-18 NOTE — ED Provider Notes (Signed)
[facility] EMERGENCY DEPARTMENT Provider Note   [name]: [id number] Arrival date & time: 01/18/19  1252     History   Chief Complaint No chief complaint on file.   HPI Derek Warren is a 23 y.o. male presenting for STD check.  Patient states in the past 1 to 2 months, he has started seeing a new male partner.  He sexually active with only this partner.  She is asymptomatic.  Patient states he is here to get tested for gonorrhea and chlamydia.  He does not want testing for HIV or syphilis.  He initially stated multiple times that he is here without symptoms, denies discharge, abdominal pain, nausea, vomiting, dysuria, hematuria, urinary frequency.  However he then states he is having clear discharge when asked why he wants to be treated.  He has no other medical problems, takes no medications daily. He does not use condoms.      HPI  History reviewed. No pertinent past medical history.  There are no active problems to display for this patient.   History reviewed. No pertinent surgical history.      Home Medications    Prior to Admission medications   Medication Sig Start Date End Date Taking? Authorizing Provider  betamethasone dipropionate (DIPROLENE) 0.05 % cream Apply 1 application topically 2 (two) times daily.    [provider]  ondansetron (ZOFRAN ODT) 4 MG disintegrating tablet Take 1 tablet (4 mg total) by mouth every 8 (eight) hours as needed for nausea or vomiting. 03/03/18   Deno Etienne, DO    Family History Family History  Problem Relation Age of Onset  . Cancer Other     Social History Social History   Tobacco Use  . Smoking status: Never Smoker  . Smokeless tobacco: Never Used  Substance Use Topics  . Alcohol use: No    Alcohol/week: 0.0 standard drinks  . Drug use: Never     Allergies   Patient has no known allergies.   Review of Systems Review of Systems  Constitutional: Negative for fever.  Genitourinary: Negative for  dysuria and hematuria.     Physical Exam Updated Vital Signs BP 125/76   Pulse 78   Temp 99.2 F (37.3 C) (Oral)   Resp 20   Ht 5\' 11"  (1.803 m)   Wt 65.8 kg   SpO2 100%   BMI 20.23 kg/m   Physical Exam Vitals signs and nursing note reviewed. Exam conducted with a chaperone present.  Constitutional:      General: He is not in acute distress.    Appearance: He is well-developed.  HENT:     Head: Normocephalic and atraumatic.  Neck:     Musculoskeletal: Normal range of motion.  Pulmonary:     Effort: Pulmonary effort is normal.  Abdominal:     General: There is no distension.     Palpations: There is no mass.     Tenderness: There is no abdominal tenderness. There is no guarding or rebound.  Genitourinary:    Penis: Circumcised.      Scrotum/Testes: Normal.     Epididymis:     Right: Normal.     Left: Normal.     Comments: No inguinal lymphadenopathy.  No swelling of the scrotum or penis.  No tenderness epididymitis.  No discharge noted.  No lesions noted. Musculoskeletal: Normal range of motion.  Lymphadenopathy:     Lower Body: No right inguinal adenopathy. No left inguinal adenopathy.  Skin:    General:  Skin is warm.     Findings: No rash.  Neurological:     Mental Status: He is alert and oriented to person, place, and time.      ED Treatments / Results  Labs (all labs ordered are listed, but only abnormal results are displayed) Labs Reviewed  GC/CHLAMYDIA PROBE AMP ([place]) NOT AT [facility]    EKG None  Radiology No results found.  Procedures Procedures (including critical care time)  Medications Ordered in ED Medications  cefTRIAXone (ROCEPHIN) injection 250 mg (has no administration in time range)  azithromycin (ZITHROMAX) tablet 1,000 mg (has no administration in time range)     Initial Impression / Assessment and Plan / ED Course  I have reviewed the triage vital signs and the nursing notes.  Pertinent labs & imaging results that were  available during my care of the patient were reviewed by me and considered in my medical decision making (see chart for details).        Patient presenting for STD evaluation.  Physical exam shows patient appears nontoxic.  It is unclear whether patient actually has symptoms, very distressed here for testing.  We do not have the penile swabs for STD testing, thus urine test was ordered.  Patient is aware that results are pending.  As patient is reporting discharge, will treat, though no discharge was found on exam.  Discussed importance of safe sex practices, and follow-up with health department for further needs.  At this time, patient appears safe for discharge.  Return precautions given.  Patient states he understands and agrees to plan.  Final Clinical Impressions(s) / ED Diagnoses   Final diagnoses:  Encounter for assessment of STD exposure  Penile discharge    ED Discharge Orders    None       Alveria Apley, PA-C 01/18/19 1410    Virgina Norfolk, DO 01/18/19 1431

## 2019-01-18 NOTE — Discharge Instructions (Addendum)
You were tested for gonorrhea and chlamydia today.  If results are positive, you will receive a phone call.  You will not need further treatment, however you will need to let your partner know when she will need to be treated.  You will have to abstain from sex to prevent reinfection. If the results are negative, you will not receive a phone call.  Either way, you may check online on MyChart. Follow-up with health department as needed for further concerns or STD testing. Return to the emergency room with any new, worsening, or concerning symptoms.

## 2019-01-18 NOTE — ED Triage Notes (Signed)
Pt states he has a penile discharge. No known STD exposure.

## 2019-01-19 ENCOUNTER — Other Ambulatory Visit: Payer: Self-pay

## 2019-01-19 DIAGNOSIS — Z20822 Contact with and (suspected) exposure to covid-19: Secondary | ICD-10-CM

## 2019-01-20 LAB — NOVEL CORONAVIRUS, NAA: SARS-CoV-2, NAA: NOT DETECTED

## 2019-01-20 LAB — GC/CHLAMYDIA PROBE AMP (~~LOC~~) NOT AT ARMC
Chlamydia: NEGATIVE
Neisseria Gonorrhea: NEGATIVE

## 2019-01-21 ENCOUNTER — Telehealth (HOSPITAL_COMMUNITY): Payer: Self-pay

## 2019-01-21 ENCOUNTER — Telehealth: Payer: Self-pay | Admitting: General Practice

## 2019-01-21 NOTE — Telephone Encounter (Signed)
Negative COVID results given. Patient results "NOT Detected." Caller expressed understanding. ° °

## 2019-04-18 ENCOUNTER — Other Ambulatory Visit: Payer: Self-pay

## 2019-04-18 ENCOUNTER — Emergency Department (HOSPITAL_BASED_OUTPATIENT_CLINIC_OR_DEPARTMENT_OTHER)
Admission: EM | Admit: 2019-04-18 | Discharge: 2019-04-18 | Disposition: A | Discharge status: Home / Self Care | Payer: BC Managed Care – PPO | Attending: Emergency Medicine | Admitting: Emergency Medicine

## 2019-04-18 ENCOUNTER — Encounter (HOSPITAL_BASED_OUTPATIENT_CLINIC_OR_DEPARTMENT_OTHER): Payer: Self-pay

## 2019-04-18 DIAGNOSIS — Z202 Contact with and (suspected) exposure to infections with a predominantly sexual mode of transmission: Secondary | ICD-10-CM | POA: Diagnosis present

## 2019-04-18 DIAGNOSIS — Z79899 Other long term (current) drug therapy: Secondary | ICD-10-CM | POA: Diagnosis not present

## 2019-04-18 LAB — RAPID HIV SCREEN (HIV 1/2 AB+AG)
HIV 1/2 Antibodies: NONREACTIVE
HIV-1 P24 Antigen - HIV24: NONREACTIVE

## 2019-04-18 MED ORDER — DOXYCYCLINE HYCLATE 100 MG PO CAPS: 100.0000 mg | ORAL_CAPSULE | Freq: Two times a day (BID) | ORAL | 0 refills | Status: DC

## 2019-04-18 MED ORDER — CEFTRIAXONE SODIUM 500 MG IJ SOLR
500.0000 mg | Freq: Once | INTRAMUSCULAR | Status: AC
  Administered 2019-04-18: 500 mg via INTRAMUSCULAR
  Filled 2019-04-18: qty 500

## 2019-04-18 NOTE — ED Triage Notes (Addendum)
Pt reports STD exposure-denies penile d/c-reports "bump" on penis-NAD-steady gait

## 2019-04-18 NOTE — ED Provider Notes (Signed)
[facility] EMERGENCY DEPARTMENT Provider Note   [name]: [id number] Arrival date & time: 04/18/19  1932     History Chief Complaint  Patient presents with  . Exposure to STD    Derek Warren is a 24 y.o. male.  HPI Patient reports he had a sexual contact 3 weeks ago that reportedly had an STD.  He reports he was told that he was exposed to an STD but not which one.  Patient denies that he has had any drainage from the penis.  He denies any pain or burning to urination.  No abdominal pain no testicular pain.  No fevers no chills no nausea no vomiting.    History reviewed. No pertinent past medical history.  There are no problems to display for this patient.   History reviewed. No pertinent surgical history.     Family History  Problem Relation Age of Onset  . Cancer Other     Social History   Tobacco Use  . Smoking status: Never Smoker  . Smokeless tobacco: Never Used  Substance Use Topics  . Alcohol use: Yes    Alcohol/week: 0.0 standard drinks    Comment: occ  . Drug use: Never    Home Medications Prior to Admission medications   Medication Sig Start Date End Date Taking? Authorizing Provider  betamethasone dipropionate (DIPROLENE) 0.05 % cream Apply 1 application topically 2 (two) times daily.    [provider]  doxycycline (VIBRAMYCIN) 100 MG capsule Take 1 capsule (100 mg total) by mouth 2 (two) times daily. One po bid x 7 days 04/18/19   Charlesetta Shanks, MD  ondansetron (ZOFRAN ODT) 4 MG disintegrating tablet Take 1 tablet (4 mg total) by mouth every 8 (eight) hours as needed for nausea or vomiting. 03/03/18   Deno Etienne, DO    Allergies    Patient has no known allergies.  Review of Systems   Review of Systems 10 Systems reviewed and are negative for acute change except as noted in the HPI. Physical Exam Updated Vital Signs BP 125/73 (BP Location: Left Arm)   Pulse 76   Temp 98.2 F (36.8 C) (Oral)   Resp 18   Ht 5\' 10"  (1.778 m)    Wt 70.8 kg   SpO2 96%   BMI 22.38 kg/m   Physical Exam Constitutional:      Comments: Patient is well in appearance.  Nontoxic and alert.  Eyes:     Extraocular Movements: Extraocular movements intact.  Pulmonary:     Effort: Pulmonary effort is normal.  Abdominal:     General: There is no distension.     Palpations: Abdomen is soft.     Tenderness: There is no abdominal tenderness. There is no guarding.  Genitourinary:    Comments: Penis is normal.  Patient has a approximately 2 mm skin colored papule that it appears to have a very minute sebaceous cyst associated.  This does not appear umbilicated or like a vesicle.  Low suspicion for STD.  Does not appear to be a papilloma however patient is counseled on possibility of papilloma and follow-up.  Urethral meatus normal.  Testicles nontender.  Scrotum without rash. Musculoskeletal:        General: Normal range of motion.  Skin:    General: Skin is warm and dry.  Neurological:     General: No focal deficit present.     Mental Status: He is oriented to person, place, and time.     Coordination: Coordination normal.  Psychiatric:        Mood and Affect: Mood normal.     ED Results / Procedures / Treatments   Labs (all labs ordered are listed, but only abnormal results are displayed) Labs Reviewed  URINALYSIS, ROUTINE W REFLEX MICROSCOPIC  RAPID HIV SCREEN (HIV 1/2 AB+AG)  RPR  GC/CHLAMYDIA PROBE AMP ([place]) NOT AT [facility]    EKG None  Radiology No results found.  Procedures Procedures (including critical care time)  Medications Ordered in ED Medications  cefTRIAXone (ROCEPHIN) injection 500 mg (500 mg Intramuscular Given 04/18/19 2046)    ED Course  I have reviewed the triage vital signs and the nursing notes.  Pertinent labs & imaging results that were available during my care of the patient were reviewed by me and considered in my medical decision making (see chart for details).    MDM  Rules/Calculators/A&P                      Patient presents for reported STD exposure.  He does not know which STD.  Patient is treated with Rocephin and doxycycline.  HIV and RPR obtained.  Patient has a innocuous appearing papule on the shaft of the penis.  This appears to be a very small sebaceous cyst.  This is not suspicious for HSV.  I doubt papilloma.  Patient is however counseled to observe it and to follow-up for recheck. Final Clinical Impression(s) / ED Diagnoses Final diagnoses:  STD exposure    Rx / DC Orders ED Discharge Orders         Ordered    doxycycline (VIBRAMYCIN) 100 MG capsule  2 times daily     04/18/19 2059           Arby Barrette, MD 04/18/19 2105

## 2019-04-18 NOTE — Discharge Instructions (Signed)
1.  You have been given a dose of Rocephin in the emergency department.  You have been given a prescription for doxycycline.  Fill this prescription and take all of the medication.  You cannot be sexually active with any untreated partners.  You can past sexually transmitted diseases back-and-forth. 2.  HIV and syphilis testing have been done as well.  These results will not be back tonight.  You can follow-up on these results on your "MyChart".   3.  Schedule a follow-up appointment at the [facility] department.

## 2019-04-19 LAB — RPR: RPR Ser Ql: NONREACTIVE

## 2019-04-20 LAB — GC/CHLAMYDIA PROBE AMP (~~LOC~~) NOT AT ARMC
Chlamydia: NEGATIVE
Neisseria Gonorrhea: NEGATIVE

## 2019-05-01 ENCOUNTER — Emergency Department (HOSPITAL_BASED_OUTPATIENT_CLINIC_OR_DEPARTMENT_OTHER)
Admission: EM | Admit: 2019-05-01 | Discharge: 2019-05-01 | Disposition: A | Discharge status: Home / Self Care | Payer: BC Managed Care – PPO | Attending: Emergency Medicine | Admitting: Emergency Medicine

## 2019-05-01 ENCOUNTER — Other Ambulatory Visit: Payer: Self-pay

## 2019-05-01 ENCOUNTER — Encounter (HOSPITAL_BASED_OUTPATIENT_CLINIC_OR_DEPARTMENT_OTHER): Payer: Self-pay | Admitting: Emergency Medicine

## 2019-05-01 DIAGNOSIS — Z202 Contact with and (suspected) exposure to infections with a predominantly sexual mode of transmission: Secondary | ICD-10-CM | POA: Diagnosis not present

## 2019-05-01 DIAGNOSIS — Z113 Encounter for screening for infections with a predominantly sexual mode of transmission: Secondary | ICD-10-CM

## 2019-05-01 DIAGNOSIS — Z79899 Other long term (current) drug therapy: Secondary | ICD-10-CM | POA: Insufficient documentation

## 2019-05-01 LAB — URINALYSIS, ROUTINE W REFLEX MICROSCOPIC
Bilirubin Urine: NEGATIVE
Glucose, UA: NEGATIVE mg/dL
Hgb urine dipstick: NEGATIVE
Ketones, ur: NEGATIVE mg/dL
Leukocytes,Ua: NEGATIVE
Nitrite: NEGATIVE
Protein, ur: NEGATIVE mg/dL
Specific Gravity, Urine: >1.03 — ABNORMAL HIGH (ref 1.005–1.030)
pH: 6 (ref 5.0–8.0)

## 2019-05-01 NOTE — ED Provider Notes (Signed)
[place] EMERGENCY DEPARTMENT Provider Note   [name]: [id number] Arrival date & time: 05/01/19  8527     History Chief Complaint  Patient presents with  . STD check    Derek Warren is a 24 y.o. male who presents to the ED today requesting STD check. Patient reports he had unprotected intercourse with a male 2 days ago and wants to be tested. He is not sure if this individual is positive for STDs however he states he "just wants to be safe." Patient denies any symptoms at this time. He does report he was seen approximately 2 to 3 weeks ago for STD exposure and was treated at that time. Per chart review patient came to the ED on 2/15 being told he was exposed to an STD however was unsure which one. He was treated with Rocephin and discharged home with doxycycline which he reports he took. His STD tests including gonorrhea, chlamydia, syphilis, HIV were all negative. Patient does not want to be retested for syphilis or HIV today. Denies fevers, chills, nausea, vomiting, abdominal pain, testicular pain or swelling, penile pain, penile discharge, dysuria, urinary frequency, any other associated symptoms.  The history is provided by the patient and medical records.       History reviewed. No pertinent past medical history.  There are no problems to display for this patient.   History reviewed. No pertinent surgical history.     Family History  Problem Relation Age of Onset  . Cancer Other     Social History   Tobacco Use  . Smoking status: Never Smoker  . Smokeless tobacco: Never Used  Substance Use Topics  . Alcohol use: Yes    Alcohol/week: 0.0 standard drinks    Comment: occ  . Drug use: Never    Home Medications Prior to Admission medications   Medication Sig Start Date End Date Taking? Authorizing Provider  betamethasone dipropionate (DIPROLENE) 0.05 % cream Apply 1 application topically 2 (two) times daily.    [provider]  doxycycline  (VIBRAMYCIN) 100 MG capsule Take 1 capsule (100 mg total) by mouth 2 (two) times daily. One po bid x 7 days 04/18/19   Charlesetta Shanks, MD  ondansetron (ZOFRAN ODT) 4 MG disintegrating tablet Take 1 tablet (4 mg total) by mouth every 8 (eight) hours as needed for nausea or vomiting. 03/03/18   Deno Etienne, DO    Allergies    Patient has no known allergies.  Review of Systems   Review of Systems  Constitutional: Negative for chills and fever.  Gastrointestinal: Negative for abdominal pain, nausea and vomiting.  Genitourinary: Negative for difficulty urinating, discharge, dysuria, frequency and scrotal swelling.    Physical Exam Updated Vital Signs BP 134/73 (BP Location: Right Arm)   Pulse 89   Temp 98.2 F (36.8 C) (Oral)   Resp 18   Ht 5\' 10"  (1.778 m)   Wt 59 kg   SpO2 99%   BMI 18.65 kg/m   Physical Exam Vitals and nursing note reviewed.  Constitutional:      Appearance: He is not ill-appearing or diaphoretic.  HENT:     Head: Normocephalic and atraumatic.  Eyes:     Conjunctiva/sclera: Conjunctivae normal.  Cardiovascular:     Rate and Rhythm: Normal rate and regular rhythm.  Pulmonary:     Effort: Pulmonary effort is normal.     Breath sounds: Normal breath sounds. No wheezing, rhonchi or rales.  Genitourinary:    Comments: Chaperone present for  exam Circumcised penis without phimosis/paraphimosis, hypospadias, erythema, tenderness, or discharge. No rashes or lesions. Testes with no masses or tenderness, no swelling, and cremasterics reflex present bilaterally. No abnormal lie. No inguinal hernias or adenopathy present. Skin:    General: Skin is warm and dry.     Coloration: Skin is not jaundiced.  Neurological:     Mental Status: He is alert.     ED Results / Procedures / Treatments   Labs (all labs ordered are listed, but only abnormal results are displayed) Labs Reviewed  URINALYSIS, ROUTINE W REFLEX MICROSCOPIC - Abnormal; Notable for the following  components:      Result Value   Specific Gravity, Urine >1.030 (*)    All other components within normal limits  GC/CHLAMYDIA PROBE AMP ([place]) NOT AT [facility]    EKG None  Radiology No results found.  Procedures Procedures (including critical care time)  Medications Ordered in ED Medications - No data to display  ED Course  I have reviewed the triage vital signs and the nursing notes.  Pertinent labs & imaging results that were available during my care of the patient were reviewed by me and considered in my medical decision making (see chart for details).  24 year old male who presents to the ED requesting STD check. He is currently asymptomatic however did have unprotected intercourse with a male partner 2 days ago. On arrival to the ED patient is afebrile, nontachycardic and nontachypneic. He has a normal GU exam today with chaperone present at bedside. He does not want to be retested for HIV and syphilis as he was tested 3 weeks ago. Discussed with patient that given the fact that he had intercourse with a new partner 2 days ago he could very well have either of these and the fact that he was -3 weeks ago does not mean much. Patient still does not want to be retested. He is requesting testing for gonorrhea and chlamydia today. Will obtain these from urinalysis. Patient to be discharged home with instructions to refrain from intercourse until he hears about his results. If he is positive he will need to come back for treatment.  This note was prepared using Dragon voice recognition software and may include unintentional dictation errors due to the inherent limitations of voice recognition software.     MDM Rules/Calculators/A&P                       Final Clinical Impression(s) / ED Diagnoses Final diagnoses:  Screening examination for STD (sexually transmitted disease)    Rx / DC Orders ED Discharge Orders    None       Discharge Instructions     We have tested  you for gonorrhea and chlamydia today. You will receive a call in 2-3 days IF you test positive. You can also log into your mychart to check the results that way. Please refrain from intercourse until you hear about your results.   If positive please return to the ED or the local Health Department to receive treatment. You will need to let your partners know at that time that they will need to be tested and treated as well.   You have declined syphilis and HIV testing today       Tanda Rockers, PA-C 05/01/19 1025    Arby Barrette, MD 05/03/19 [phone]

## 2019-05-01 NOTE — ED Triage Notes (Signed)
Pt reports having intercourse with a stranger on Friday and wants to make sure he doesn't have an STI. No sx.

## 2019-05-01 NOTE — Discharge Instructions (Addendum)
We have tested you for gonorrhea and chlamydia today. You will receive a call in 2-3 days IF you test positive. You can also log into your mychart to check the results that way. Please refrain from intercourse until you hear about your results.   If positive please return to the ED or the local Health Department to receive treatment. You will need to let your partners know at that time that they will need to be tested and treated as well.   You have declined syphilis and HIV testing today

## 2019-05-03 LAB — GC/CHLAMYDIA PROBE AMP (~~LOC~~) NOT AT ARMC
Chlamydia: NEGATIVE
Neisseria Gonorrhea: NEGATIVE

## 2019-07-31 ENCOUNTER — Encounter (HOSPITAL_BASED_OUTPATIENT_CLINIC_OR_DEPARTMENT_OTHER): Payer: Self-pay | Admitting: *Deleted

## 2019-07-31 ENCOUNTER — Emergency Department (HOSPITAL_BASED_OUTPATIENT_CLINIC_OR_DEPARTMENT_OTHER)
Admission: EM | Admit: 2019-07-31 | Discharge: 2019-07-31 | Disposition: A | Discharge status: Home / Self Care | Payer: BC Managed Care – PPO | Attending: Emergency Medicine | Admitting: Emergency Medicine

## 2019-07-31 ENCOUNTER — Other Ambulatory Visit: Payer: Self-pay

## 2019-07-31 DIAGNOSIS — Z79899 Other long term (current) drug therapy: Secondary | ICD-10-CM | POA: Insufficient documentation

## 2019-07-31 DIAGNOSIS — Z202 Contact with and (suspected) exposure to infections with a predominantly sexual mode of transmission: Secondary | ICD-10-CM | POA: Diagnosis present

## 2019-07-31 DIAGNOSIS — Z711 Person with feared health complaint in whom no diagnosis is made: Secondary | ICD-10-CM

## 2019-07-31 HISTORY — DX: Unspecified sexually transmitted disease: A64

## 2019-07-31 NOTE — ED Triage Notes (Signed)
Requesting STD check. Hx of same. Denies symptoms or exposure

## 2019-07-31 NOTE — ED Provider Notes (Signed)
[facility] EMERGENCY DEPARTMENT Provider Note   [name]: [id number] Arrival date & time: 07/31/19  1727     History Chief Complaint  Patient presents with  . SEXUALLY TRANSMITTED DISEASE    Derek Warren is a 24 y.o. male.  Derek Warren is a 24 y.o. male with a history of sexually transmitted infections, who presents to the ED requesting STD check.  Patient reports that he had unprotected sex yesterday with a new partner and just wants to be checked out.  He denies any symptoms at this time.  No penile discharge, genital lesions, dysuria, penile pain, testicular pain or swelling.  No abdominal pain, fevers or vomiting.  Patient denies any rashes.  He is sexually active with females.  He has been seen in the ED multiple times recently for STD testing.        Past Medical History:  Diagnosis Date  . STD (sexually transmitted disease)     There are no problems to display for this patient.   History reviewed. No pertinent surgical history.     Family History  Problem Relation Age of Onset  . Cancer Other     Social History   Tobacco Use  . Smoking status: Never Smoker  . Smokeless tobacco: Never Used  Substance Use Topics  . Alcohol use: Yes    Alcohol/week: 0.0 standard drinks    Comment: occ  . Drug use: Never    Home Medications Prior to Admission medications   Medication Sig Start Date End Date Taking? Authorizing Provider  betamethasone dipropionate (DIPROLENE) 0.05 % cream Apply 1 application topically 2 (two) times daily.    [provider]  doxycycline (VIBRAMYCIN) 100 MG capsule Take 1 capsule (100 mg total) by mouth 2 (two) times daily. One po bid x 7 days 04/18/19   Charlesetta Shanks, MD  ondansetron (ZOFRAN ODT) 4 MG disintegrating tablet Take 1 tablet (4 mg total) by mouth every 8 (eight) hours as needed for nausea or vomiting. 03/03/18   Deno Etienne, DO    Allergies    Patient has no known allergies.  Review of Systems   Review of  Systems  Constitutional: Negative for chills and fever.  Gastrointestinal: Negative for abdominal pain, nausea and vomiting.  Genitourinary: Negative for discharge, dysuria, frequency, genital sores, penile pain, penile swelling, scrotal swelling and testicular pain.  Skin: Negative for rash.  All other systems reviewed and are negative.   Physical Exam Updated Vital Signs BP 134/71 (BP Location: Left Arm)   Pulse 72   Temp 98.4 F (36.9 C) (Oral)   Resp 18   Ht 5\' 10"  (1.778 m)   Wt 63.5 kg   SpO2 99%   BMI 20.09 kg/m   Physical Exam Vitals and nursing note reviewed. Exam conducted with a chaperone present.  Constitutional:      General: He is not in acute distress.    Appearance: Normal appearance. He is well-developed and normal weight. He is not ill-appearing or diaphoretic.  HENT:     Head: Normocephalic and atraumatic.  Eyes:     General:        Right eye: No discharge.        Left eye: No discharge.  Pulmonary:     Effort: Pulmonary effort is normal. No respiratory distress.  Abdominal:     General: Abdomen is flat. Bowel sounds are normal. There is no distension.     Palpations: Abdomen is soft. There is no mass.  Tenderness: There is no abdominal tenderness. There is no guarding.  Genitourinary:    Penis: Normal.      Testes: Normal.  Musculoskeletal:        General: No deformity.  Skin:    General: Skin is warm and dry.  Neurological:     Mental Status: He is alert and oriented to person, place, and time.     Coordination: Coordination normal.  Psychiatric:        Mood and Affect: Mood normal.        Behavior: Behavior normal.     ED Results / Procedures / Treatments   Labs (all labs ordered are listed, but only abnormal results are displayed) Labs Reviewed  GC/CHLAMYDIA PROBE AMP ([place]) NOT AT [facility]    EKG None  Radiology No results found.  Procedures Procedures (including critical care time)  Medications Ordered in  ED Medications - No data to display  ED Course  I have reviewed the triage vital signs and the nursing notes.  Pertinent labs & imaging results that were available during my care of the patient were reviewed by me and considered in my medical decision making (see chart for details).    MDM Rules/Calculators/A&P                      Patient is afebrile without abdominal tenderness, abdominal pain or painful bowel movements to indicate prostatitis.  No tenderness to palpation of the testes or epididymis to suggest orchitis or epididymitis.  STD cultures obtained gonorrhea and chlamydia. Patient to be discharged with instructions to follow up with PCP. Discussed importance of using protection when sexually active. Pt understands that they have GC/Chlamydia cultures pending and that they will need to inform all sexual partners if results return positive.  Offered prophylactic treatment patient declined.  I also had long discussion with patient regarding the fact that if he was just exposed yesterday it may be too early for his test to return positive so he may need to be retested especially if he develops any symptoms despite negative testing today.  I have also encouraged him to get testing for HIV and syphilis, but he did not wish to do this today.  He has been seen in the ED multiple times for STD testing and I have encouraged him to follow-up with the health department and provided phone number for appointment.   Final Clinical Impression(s) / ED Diagnoses Final diagnoses:  Concern about STD in male without diagnosis    Rx / DC Orders ED Discharge Orders    None       Legrand Rams 07/31/19 2011    Terald Sleeper, MD 07/31/19 2318

## 2019-07-31 NOTE — Discharge Instructions (Addendum)
Please use protection when you are sexually active.   You were tested for gonorrhea and chlamydia.  Given that you were exposed only yesterday pit may be too early for your test results to be positive. If you develop any symptoms you should schedule an appointment to be tested again. You will be called in the next 2 to 3 days if any results are positive, you can also review negative results online.  Please make sure you are using protection when sexually active to help prevent further STDs.  You can go to the health department for any future STD testing needs.   You can get STD testing and treatment at the [facility] department.  To make an appointment in either the [facility] or [facility] clinic locations, please call [phone]. The cost is free at both sites and all services are confidential.

## 2019-07-31 NOTE — ED Notes (Signed)
Pt discharged to home. Discharge instructions have been discussed with patient and/or family members. Pt verbally acknowledges understanding d/c instructions, and endorses comprehension to checkout at registration before leaving.  °

## 2019-08-03 LAB — GC/CHLAMYDIA PROBE AMP (~~LOC~~) NOT AT ARMC
Chlamydia: NEGATIVE
Comment: NEGATIVE
Comment: NORMAL
Neisseria Gonorrhea: NEGATIVE

## 2020-11-16 ENCOUNTER — Other Ambulatory Visit: Payer: Self-pay

## 2020-11-16 ENCOUNTER — Ambulatory Visit
Admission: EM | Admit: 2020-11-16 | Discharge: 2020-11-16 | Disposition: A | Discharge status: Home / Self Care | Payer: Commercial Managed Care - PPO | Attending: Urgent Care | Admitting: Urgent Care

## 2020-11-16 DIAGNOSIS — Z20822 Contact with and (suspected) exposure to covid-19: Secondary | ICD-10-CM | POA: Insufficient documentation

## 2020-11-16 DIAGNOSIS — M7918 Myalgia, other site: Secondary | ICD-10-CM | POA: Diagnosis present

## 2020-11-16 DIAGNOSIS — Z01812 Encounter for preprocedural laboratory examination: Secondary | ICD-10-CM | POA: Diagnosis present

## 2020-11-16 DIAGNOSIS — Z113 Encounter for screening for infections with a predominantly sexual mode of transmission: Secondary | ICD-10-CM | POA: Insufficient documentation

## 2020-11-16 MED ORDER — NAPROXEN 500 MG PO TABS: 500.0000 mg | ORAL_TABLET | Freq: Two times a day (BID) | ORAL | 0 refills | Status: DC

## 2020-11-16 MED ORDER — DOXYCYCLINE HYCLATE 100 MG PO CAPS: 100.0000 mg | ORAL_CAPSULE | Freq: Two times a day (BID) | ORAL | 0 refills | Status: DC

## 2020-11-16 NOTE — ED Notes (Signed)
Pt also requests sti screening during this visit. Denies symptoms.

## 2020-11-16 NOTE — ED Triage Notes (Signed)
Pt c/o "bump" midline between upper gluteals states it feels deep under skin and is painful to sit. Denies drainage.

## 2020-11-16 NOTE — Discharge Instructions (Signed)
I do not see signs of a pilonidal cyst but it could be deeper within your buttock area. Start doxycycline for infection. Use naproxen for pain and inflammation and follow up with [facility].

## 2020-11-16 NOTE — ED Provider Notes (Addendum)
[facility]   MRN: [id number] DOB: Dec 01, 1995  Subjective:   Derek Warren is a 25 y.o. male presenting for several day history of a painful bump over the buttock area.  Patient states that he is concerned about infection and pilonidal cyst.  Denies having ever been diagnosed with this.  No fever, drainage of pus or bleeding.  No current facility-administered medications for this encounter.  Current Outpatient Medications:    betamethasone dipropionate (DIPROLENE) 0.05 % cream, Apply 1 application topically 2 (two) times daily., Disp: , Rfl:    doxycycline (VIBRAMYCIN) 100 MG capsule, Take 1 capsule (100 mg total) by mouth 2 (two) times daily. One po bid x 7 days, Disp: 14 capsule, Rfl: 0   ondansetron (ZOFRAN ODT) 4 MG disintegrating tablet, Take 1 tablet (4 mg total) by mouth every 8 (eight) hours as needed for nausea or vomiting., Disp: 20 tablet, Rfl: 0   No Known Allergies  Past Medical History:  Diagnosis Date   STD (sexually transmitted disease)      History reviewed. No pertinent surgical history.  Family History  Problem Relation Age of Onset   Cancer Other     Social History   Tobacco Use   Smoking status: Never   Smokeless tobacco: Never  Vaping Use   Vaping Use: Never used  Substance Use Topics   Alcohol use: Yes    Alcohol/week: 0.0 standard drinks    Comment: occ   Drug use: Never    ROS   Objective:   Vitals: BP 118/75 (BP Location: Left Arm)   Pulse 63   Temp 98.1 F (36.7 C) (Oral)   Resp 18   SpO2 98%   Physical Exam Constitutional:      General: He is not in acute distress.    Appearance: Normal appearance. He is well-developed and normal weight. He is not ill-appearing, toxic-appearing or diaphoretic.  HENT:     Head: Normocephalic and atraumatic.     Right Ear: External ear normal.     Left Ear: External ear normal.     Nose: Nose normal.     Mouth/Throat:     Pharynx: Oropharynx is clear.  Eyes:     General: No  scleral icterus.       Right eye: No discharge.        Left eye: No discharge.     Extraocular Movements: Extraocular movements intact.     Pupils: Pupils are equal, round, and reactive to light.  Cardiovascular:     Rate and Rhythm: Normal rate.  Pulmonary:     Effort: Pulmonary effort is normal.  Musculoskeletal:     Cervical back: Normal range of motion.  Skin:      Neurological:     Mental Status: He is alert and oriented to person, place, and time.  Psychiatric:        Mood and Affect: Mood normal.        Behavior: Behavior normal.        Thought Content: Thought content normal.        Judgment: Judgment normal.     Assessment and Plan :   PDMP not reviewed this encounter.  1. Acute buttock pain   2. Encounter for preprocedure screening laboratory testing for COVID-19   3. Screen for STD (sexually transmitted disease)     Will cover for infectious process with doxycycline, naproxen for pain and inflammation.  Area is not amenable to incision and drainage or even aspiration.  Recommended follow-up with [facility] as there is concern for pilonidal cyst.  Use warm compresses otherwise in addition to the antibiotic and NSAID. Counseled patient on potential for adverse effects with medications prescribed/recommended today, ER and return-to-clinic precautions discussed, patient verbalized understanding.  Screen for STD pending.     Wallis Bamberg, PA-C 11/16/20 1040

## 2020-11-19 LAB — CYTOLOGY, (ORAL, ANAL, URETHRAL) ANCILLARY ONLY
Chlamydia: NEGATIVE
Comment: NEGATIVE
Comment: NEGATIVE
Comment: NORMAL
Neisseria Gonorrhea: NEGATIVE
Trichomonas: POSITIVE — AB

## 2020-11-20 ENCOUNTER — Telehealth (HOSPITAL_COMMUNITY): Payer: Self-pay | Admitting: Emergency Medicine

## 2020-11-20 MED ORDER — METRONIDAZOLE 500 MG PO TABS: 2000.0000 mg | ORAL_TABLET | Freq: Once | ORAL | 0 refills | Status: AC

## 2020-12-04 ENCOUNTER — Other Ambulatory Visit: Payer: Self-pay

## 2020-12-04 ENCOUNTER — Ambulatory Visit
Admission: EM | Admit: 2020-12-04 | Discharge: 2020-12-04 | Disposition: A | Discharge status: Home / Self Care | Payer: Commercial Managed Care - PPO | Attending: Internal Medicine | Admitting: Internal Medicine

## 2020-12-04 DIAGNOSIS — Z113 Encounter for screening for infections with a predominantly sexual mode of transmission: Secondary | ICD-10-CM | POA: Insufficient documentation

## 2020-12-04 DIAGNOSIS — Z20822 Contact with and (suspected) exposure to covid-19: Secondary | ICD-10-CM | POA: Insufficient documentation

## 2020-12-04 NOTE — ED Triage Notes (Signed)
Pt was recently tx for trichomoniasis. Today, he wants to retest. No sxs reported. No known exposures.

## 2020-12-04 NOTE — Discharge Instructions (Addendum)
You penile swab is pending. We will call if it is positive.

## 2020-12-04 NOTE — ED Provider Notes (Signed)
[name]    [name]: [id number] Arrival date & time: 12/04/20  1821      History   Chief Complaint Chief Complaint  Patient presents with   std screening    HPI Derek Warren is a 25 y.o. male.   Patient presents for routine STD testing.  Patient recently tested positive for trichomonas on 11/16/20.  Patient wishes to have a retest to make sure that infection is clear.  Denies any recent exposures or any current symptoms.  Patient also requesting COVID-19 testing.  Patient wishes to have routine COVID-19 testing and denies any current symptoms or exposure.  Patient also requesting HIV and syphilis testing but denies exposure.    Past Medical History:  Diagnosis Date   STD (sexually transmitted disease)     There are no problems to display for this patient.   History reviewed. No pertinent surgical history.     Home Medications    Prior to Admission medications   Medication Sig Start Date End Date Taking? Authorizing Provider  betamethasone dipropionate (DIPROLENE) 0.05 % cream Apply 1 application topically 2 (two) times daily.    [provider]  doxycycline (VIBRAMYCIN) 100 MG capsule Take 1 capsule (100 mg total) by mouth 2 (two) times daily. One po bid x 7 days 11/16/20   Wallis Bamberg, PA-C  naproxen (NAPROSYN) 500 MG tablet Take 1 tablet (500 mg total) by mouth 2 (two) times daily with a meal. 11/16/20   Wallis Bamberg, PA-C  ondansetron (ZOFRAN ODT) 4 MG disintegrating tablet Take 1 tablet (4 mg total) by mouth every 8 (eight) hours as needed for nausea or vomiting. 03/03/18   Melene Plan, DO    Family History Family History  Problem Relation Age of Onset   Cancer Other     Social History Social History   Tobacco Use   Smoking status: Never   Smokeless tobacco: Never  Vaping Use   Vaping Use: Never used  Substance Use Topics   Alcohol use: Yes    Alcohol/week: 0.0 standard drinks    Comment: occ   Drug use: Never     Allergies    Patient has no known allergies.   Review of Systems Review of Systems Per HPI  Physical Exam Triage Vital Signs ED Triage Vitals  Enc Vitals Group     BP 12/04/20 1852 114/75     Pulse Rate 12/04/20 1852 76     Resp 12/04/20 1852 18     Temp 12/04/20 1852 98 F (36.7 C)     Temp Source 12/04/20 1852 Oral     SpO2 12/04/20 1852 98 %     Weight --      Height --      Head Circumference --      Peak Flow --      Pain Score 12/04/20 1855 0     Pain Loc --      Pain Edu? --      Excl. in [place]? --    No data found.  Updated Vital Signs BP 114/75 (BP Location: Left Arm)   Pulse 76   Temp 98 F (36.7 C) (Oral)   Resp 18   SpO2 98%   Visual Acuity Right Eye Distance:   Left Eye Distance:   Bilateral Distance:    Right Eye Near:   Left Eye Near:    Bilateral Near:     Physical Exam Constitutional:      Appearance: Normal appearance.  HENT:  Head: Normocephalic and atraumatic.  Eyes:     Extraocular Movements: Extraocular movements intact.     Conjunctiva/sclera: Conjunctivae normal.  Pulmonary:     Effort: Pulmonary effort is normal.  Genitourinary:    Comments: Deferred with shared decision making.  Self swab performed. Neurological:     General: No focal deficit present.     Mental Status: He is alert and oriented to person, place, and time. Mental status is at baseline.  Psychiatric:        Mood and Affect: Mood normal.        Behavior: Behavior normal.        Thought Content: Thought content normal.        Judgment: Judgment normal.     UC Treatments / Results  Labs (all labs ordered are listed, but only abnormal results are displayed) Labs Reviewed  NOVEL CORONAVIRUS, NAA  HIV ANTIBODY (ROUTINE TESTING W REFLEX)  RPR  CYTOLOGY, (ORAL, ANAL, URETHRAL) ANCILLARY ONLY    EKG   Radiology No results found.  Procedures Procedures (including critical care time)  Medications Ordered in UC Medications - No data to display  Initial  Impression / Assessment and Plan / UC Course  I have reviewed the triage vital signs and the nursing notes.  Pertinent labs & imaging results that were available during my care of the patient were reviewed by me and considered in my medical decision making (see chart for details).     Urethral swab pending.  Blood work pending.  COVID-19 test pending. This is a routine screening with no current symptoms or exposure. Will treat if test results are positive.Discussed strict return precautions. Patient verbalized understanding and is agreeable with plan.  Final Clinical Impressions(s) / UC Diagnoses   Final diagnoses:  Screening examination for venereal disease  Encounter for laboratory testing for COVID-19 virus     Discharge Instructions      You penile swab is pending. We will call if it is positive.      ED Prescriptions   None    PDMP not reviewed this encounter.   Lance Muss, FNP 12/04/20 1911

## 2020-12-05 LAB — RPR: RPR Ser Ql: NONREACTIVE

## 2020-12-05 LAB — SARS-COV-2, NAA 2 DAY TAT

## 2020-12-05 LAB — HIV ANTIBODY (ROUTINE TESTING W REFLEX): HIV Screen 4th Generation wRfx: NONREACTIVE

## 2020-12-05 LAB — NOVEL CORONAVIRUS, NAA: SARS-CoV-2, NAA: NOT DETECTED

## 2020-12-06 LAB — CYTOLOGY, (ORAL, ANAL, URETHRAL) ANCILLARY ONLY
Chlamydia: NEGATIVE
Comment: NEGATIVE
Comment: NEGATIVE
Comment: NORMAL
Neisseria Gonorrhea: NEGATIVE
Trichomonas: NEGATIVE

## 2021-01-30 ENCOUNTER — Encounter: Payer: Self-pay | Admitting: Emergency Medicine

## 2021-01-30 ENCOUNTER — Ambulatory Visit
Admission: EM | Admit: 2021-01-30 | Discharge: 2021-01-30 | Disposition: A | Discharge status: Home / Self Care | Payer: Commercial Managed Care - PPO | Attending: Physician Assistant | Admitting: Physician Assistant

## 2021-01-30 ENCOUNTER — Other Ambulatory Visit: Payer: Self-pay

## 2021-01-30 DIAGNOSIS — Z1152 Encounter for screening for COVID-19: Secondary | ICD-10-CM | POA: Insufficient documentation

## 2021-01-30 DIAGNOSIS — Z113 Encounter for screening for infections with a predominantly sexual mode of transmission: Secondary | ICD-10-CM | POA: Diagnosis present

## 2021-01-30 NOTE — ED Provider Notes (Signed)
[name]    [name]: [id number] Arrival date & time: 01/30/21  0854      History   Chief Complaint Chief Complaint  Patient presents with   Exposure to STD    HPI Derek Warren is a 25 y.o. male.   Patient here today for STD screening.  He states he is not having any current symptoms but tested positive for trichomoniasis a few months ago and wants to be positive that infection cleared.  He also would like COVID screening.  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  The history is provided by the patient.  Exposure to STD Pertinent negatives include no abdominal pain and no shortness of breath.   Past Medical History:  Diagnosis Date   STD (sexually transmitted disease)     There are no problems to display for this patient.   History reviewed. No pertinent surgical history.     Home Medications    Prior to Admission medications   Medication Sig Start Date End Date Taking? Authorizing Provider  betamethasone dipropionate (DIPROLENE) 0.05 % cream Apply 1 application topically 2 (two) times daily.    [provider]  doxycycline (VIBRAMYCIN) 100 MG capsule Take 1 capsule (100 mg total) by mouth 2 (two) times daily. One po bid x 7 days 11/16/20   Wallis Bamberg, PA-C  naproxen (NAPROSYN) 500 MG tablet Take 1 tablet (500 mg total) by mouth 2 (two) times daily with a meal. 11/16/20   Wallis Bamberg, PA-C  ondansetron (ZOFRAN ODT) 4 MG disintegrating tablet Take 1 tablet (4 mg total) by mouth every 8 (eight) hours as needed for nausea or vomiting. 03/03/18   Melene Plan, DO    Family History Family History  Problem Relation Age of Onset   Cancer Other     Social History Social History   Tobacco Use   Smoking status: Never   Smokeless tobacco: Never  Vaping Use   Vaping Use: Never used  Substance Use Topics   Alcohol use: Yes    Alcohol/week: 0.0 standard drinks    Comment: occ   Drug use: Never      Allergies   Patient has no known allergies.   Review of Systems Review of Systems  Constitutional:  Negative for chills and fever.  HENT:  Negative for congestion and ear pain.   Eyes:  Negative for discharge and redness.  Respiratory:  Negative for cough and shortness of breath.   Gastrointestinal:  Negative for abdominal pain, nausea and vomiting.  Genitourinary:  Negative for dysuria and penile discharge.  Skin:  Positive for color change and wound.  Neurological:  Negative for numbness.    Physical Exam Triage Vital Signs ED Triage Vitals [01/30/21 0913]  Enc Vitals Group     BP 121/73     Pulse Rate 63     Resp      Temp 98 F (36.7 C)     Temp Source Oral     SpO2 98 %     Weight 150 lb (68 kg)     Height 5\' 10"  (1.778 m)     Head Circumference      Peak Flow      Pain Score 0     Pain Loc      Pain Edu?      Excl. in [place]?    No data found.  Updated Vital Signs BP 121/73 (BP Location: Left Arm)   Pulse 63  Temp 98 F (36.7 C) (Oral)   Ht 5\' 10"  (1.778 m)   Wt 150 lb (68 kg)   SpO2 98%   BMI 21.52 kg/m      Physical Exam Vitals and nursing note reviewed.  Constitutional:      General: He is not in acute distress.    Appearance: Normal appearance. He is not ill-appearing.  HENT:     Head: Normocephalic and atraumatic.  Eyes:     Conjunctiva/sclera: Conjunctivae normal.  Cardiovascular:     Rate and Rhythm: Normal rate.  Pulmonary:     Effort: Pulmonary effort is normal.  Neurological:     Mental Status: He is alert.  Psychiatric:        Mood and Affect: Mood normal.        Behavior: Behavior normal.        Thought Content: Thought content normal.     UC Treatments / Results  Labs (all labs ordered are listed, but only abnormal results are displayed) Labs Reviewed  NOVEL CORONAVIRUS, NAA  CYTOLOGY, (ORAL, ANAL, URETHRAL) ANCILLARY ONLY    EKG   Radiology No results found.  Procedures Procedures (including critical care  time)  Medications Ordered in UC Medications - No data to display  Initial Impression / Assessment and Plan / UC Course  I have reviewed the triage vital signs and the nursing notes.  Pertinent labs & imaging results that were available during my care of the patient were reviewed by me and considered in my medical decision making (see chart for details).    Screening ordered for COVID and STDs as requested.  Will await results for further recommendation.  Final Clinical Impressions(s) / UC Diagnoses   Final diagnoses:  Encounter for screening for COVID-19  Screening for STD (sexually transmitted disease)   Discharge Instructions   None    ED Prescriptions   None    PDMP not reviewed this encounter.   Francene Finders, PA-C 01/30/21 1002

## 2021-01-30 NOTE — ED Triage Notes (Signed)
Patient requesting STD testing.  No sx's.  Also requesting COVID testing.

## 2021-01-31 LAB — CYTOLOGY, (ORAL, ANAL, URETHRAL) ANCILLARY ONLY
Chlamydia: NEGATIVE
Comment: NEGATIVE
Comment: NEGATIVE
Comment: NORMAL
Neisseria Gonorrhea: NEGATIVE
Trichomonas: NEGATIVE

## 2021-01-31 LAB — NOVEL CORONAVIRUS, NAA: SARS-CoV-2, NAA: NOT DETECTED

## 2021-03-09 ENCOUNTER — Ambulatory Visit
Admission: EM | Admit: 2021-03-09 | Discharge: 2021-03-09 | Disposition: A | Discharge status: Home / Self Care | Payer: Commercial Managed Care - PPO | Attending: Physician Assistant | Admitting: Physician Assistant

## 2021-03-09 ENCOUNTER — Encounter: Payer: Self-pay | Admitting: Physician Assistant

## 2021-03-09 ENCOUNTER — Other Ambulatory Visit: Payer: Self-pay

## 2021-03-09 DIAGNOSIS — R112 Nausea with vomiting, unspecified: Secondary | ICD-10-CM | POA: Diagnosis not present

## 2021-03-09 DIAGNOSIS — R6883 Chills (without fever): Secondary | ICD-10-CM | POA: Diagnosis not present

## 2021-03-09 DIAGNOSIS — L720 Epidermal cyst: Secondary | ICD-10-CM | POA: Diagnosis not present

## 2021-03-09 MED ORDER — AMOXICILLIN 500 MG PO CAPS: 500.0000 mg | ORAL_CAPSULE | Freq: Three times a day (TID) | ORAL | 0 refills | Status: DC

## 2021-03-09 NOTE — ED Triage Notes (Signed)
Two day h/o cough, runny nose, fatigue and fever. Pt also reports 2 days, ago he noticed a hard lump on the right side of his neck. Lump is tender to the touch and it is interfering with patient's sleep. No drainage from mass. Confirms intermittent nausea with emesis x3. No diarrhea or sore throat.

## 2021-03-09 NOTE — Discharge Instructions (Signed)
Follow up with PCP if no improvement Go to ED with worsening symptoms.

## 2021-03-09 NOTE — ED Provider Notes (Signed)
[name]    [name]: [id number] Arrival date & time: 03/09/21  0807      History   Chief Complaint Chief Complaint  Patient presents with   mass on neck    right    HPI Derek Warren is a 26 y.o. male.   Patient here c/w nodule R side neck x 2 days.  Admits pain, tenderness.  He notes he had fever and chills 2 days ago, 2 episodes of vomiting last night.  Denies drainage, color change, URI sx, dental pain, difficulty swallowing, difficulty breathing.  He reports nausea and vomiting improved today.   Past Medical History:  Diagnosis Date   STD (sexually transmitted disease)     There are no problems to display for this patient.   History reviewed. No pertinent surgical history.     Home Medications    Prior to Admission medications   Medication Sig Start Date End Date Taking? Authorizing Provider  amoxicillin (AMOXIL) 500 MG capsule Take 1 capsule (500 mg total) by mouth 3 (three) times daily. 03/09/21  Yes Peri Jefferson, PA-C  betamethasone dipropionate (DIPROLENE) 0.05 % cream Apply 1 application topically 2 (two) times daily.    [provider]  doxycycline (VIBRAMYCIN) 100 MG capsule Take 1 capsule (100 mg total) by mouth 2 (two) times daily. One po bid x 7 days 11/16/20   Jaynee Eagles, PA-C  naproxen (NAPROSYN) 500 MG tablet Take 1 tablet (500 mg total) by mouth 2 (two) times daily with a meal. 11/16/20   Jaynee Eagles, PA-C  ondansetron (ZOFRAN ODT) 4 MG disintegrating tablet Take 1 tablet (4 mg total) by mouth every 8 (eight) hours as needed for nausea or vomiting. 03/03/18   Deno Etienne, DO    Family History Family History  Problem Relation Age of Onset   Cancer Other     Social History Social History   Tobacco Use   Smoking status: Never   Smokeless tobacco: Never  Vaping Use   Vaping Use: Never used  Substance Use Topics   Alcohol use: Yes    Alcohol/week: 0.0 standard drinks    Comment: occ   Drug use: Never     Allergies    Patient has no known allergies.   Review of Systems Review of Systems  Constitutional:  Positive for chills, fatigue and fever (subjective, did not check temp).  HENT:  Negative for congestion, ear pain, nosebleeds, postnasal drip, rhinorrhea, sinus pressure, sinus pain and sore throat.   Eyes:  Negative for pain and redness.  Respiratory:  Negative for cough, shortness of breath and wheezing.   Gastrointestinal:  Positive for nausea and vomiting. Negative for abdominal pain and diarrhea.  Musculoskeletal:  Positive for myalgias. Negative for arthralgias.  Skin:  Negative for color change and rash.  Neurological:  Negative for light-headedness and headaches.  Hematological:  Negative for adenopathy. Does not bruise/bleed easily.  Psychiatric/Behavioral:  Negative for confusion and sleep disturbance.     Physical Exam Triage Vital Signs ED Triage Vitals  Enc Vitals Group     BP 03/09/21 0828 120/84     Pulse Rate 03/09/21 0828 89     Resp 03/09/21 0828 18     Temp 03/09/21 0828 99.7 F (37.6 C)     Temp Source 03/09/21 0828 Oral     SpO2 03/09/21 0828 98 %     Weight --      Height --      Head Circumference --  Peak Flow --      Pain Score 03/09/21 0832 8     Pain Loc --      Pain Edu? --      Excl. in [place]? --    No data found.  Updated Vital Signs BP 120/84 (BP Location: Left Arm)    Pulse 89    Temp 99.7 F (37.6 C) (Oral)    Resp 18    SpO2 98%   Visual Acuity Right Eye Distance:   Left Eye Distance:   Bilateral Distance:    Right Eye Near:   Left Eye Near:    Bilateral Near:     Physical Exam Vitals and nursing note reviewed.  Constitutional:      General: He is not in acute distress.    Appearance: He is well-developed.  HENT:     Head: Normocephalic and atraumatic.     Right Ear: Tympanic membrane and ear canal normal.     Left Ear: Tympanic membrane and ear canal normal.     Nose: Nose normal. No congestion or rhinorrhea.     Mouth/Throat:      Pharynx: No posterior oropharyngeal erythema.  Eyes:     General: No scleral icterus.    Extraocular Movements: Extraocular movements intact.     Conjunctiva/sclera: Conjunctivae normal.  Neck:     Trachea: Trachea normal.   Cardiovascular:     Rate and Rhythm: Normal rate and regular rhythm.     Heart sounds: No murmur heard. Pulmonary:     Effort: Pulmonary effort is normal. No respiratory distress.  Musculoskeletal:        General: No swelling.     Cervical back: Normal range of motion and neck supple. No rigidity.  Lymphadenopathy:     Cervical: No cervical adenopathy.     Right cervical: No superficial, deep or posterior cervical adenopathy. Skin:    General: Skin is warm and dry.     Capillary Refill: Capillary refill takes less than 2 seconds.  Neurological:     General: No focal deficit present.     Mental Status: He is alert and oriented to person, place, and time.  Psychiatric:        Mood and Affect: Mood normal.        Behavior: Behavior normal.     UC Treatments / Results  Labs (all labs ordered are listed, but only abnormal results are displayed) Labs Reviewed - No data to display  EKG   Radiology No results found.  Procedures Procedures (including critical care time)  Medications Ordered in UC Medications - No data to display  Initial Impression / Assessment and Plan / UC Course  I have reviewed the triage vital signs and the nursing notes.  Pertinent labs & imaging results that were available during my care of the patient were reviewed by me and considered in my medical decision making (see chart for details).     Unclear etology of nodule, could be reactionary lymphadenopathy given history of chills and vomiting.  Will treat with antibiotics, patient instructed to follow up with PCP if no improvement or go to ED with worsening symptoms. Final Clinical Impressions(s) / UC Diagnoses   Final diagnoses:  Chills  Nausea and vomiting,  unspecified vomiting type  Epidermal cyst     Discharge Instructions      Follow up with PCP if no improvement Go to ED with worsening symptoms.    ED Prescriptions     Medication Sig  Dispense Auth. Provider   amoxicillin (AMOXIL) 500 MG capsule Take 1 capsule (500 mg total) by mouth 3 (three) times daily. 21 capsule Peri Jefferson, PA-C      PDMP not reviewed this encounter.   Peri Jefferson, PA-C 03/09/21 0845

## 2021-03-11 ENCOUNTER — Ambulatory Visit
Admission: EM | Admit: 2021-03-11 | Discharge: 2021-03-11 | Disposition: A | Discharge status: Home / Self Care | Payer: Commercial Managed Care - PPO | Attending: Internal Medicine | Admitting: Internal Medicine

## 2021-03-11 ENCOUNTER — Other Ambulatory Visit: Payer: Self-pay

## 2021-03-11 DIAGNOSIS — J069 Acute upper respiratory infection, unspecified: Secondary | ICD-10-CM | POA: Diagnosis not present

## 2021-03-11 DIAGNOSIS — R590 Localized enlarged lymph nodes: Secondary | ICD-10-CM | POA: Diagnosis not present

## 2021-03-11 DIAGNOSIS — R051 Acute cough: Secondary | ICD-10-CM | POA: Diagnosis not present

## 2021-03-11 LAB — POCT RAPID STREP A (OFFICE): Rapid Strep A Screen: NEGATIVE

## 2021-03-11 LAB — POCT MONO SCREEN (KUC): Mono, POC: NEGATIVE

## 2021-03-11 MED ORDER — CLINDAMYCIN HCL 150 MG PO CAPS: 300.0000 mg | ORAL_CAPSULE | Freq: Three times a day (TID) | ORAL | 0 refills | Status: AC

## 2021-03-11 NOTE — ED Triage Notes (Signed)
Pt presents today needing a covid swab in order to return to work. Pt was also last seen on 1/7 for a cyst on his neck. Pt is concerned about the cyst reporting no change in sxs since starting the meds.

## 2021-03-11 NOTE — ED Provider Notes (Addendum)
[name]    [name]: [id number] Arrival date & time: 03/11/21  0805      History   Chief Complaint Chief Complaint  Patient presents with   Cyst    neck    HPI Derek Warren is a 26 y.o. male.   Patient presents today for persistent swelling to the right neck, nausea, vomiting, chills, cough, nasal congestion.  Patient was seen on 03/09/2021 and was prescribed amoxicillin antibiotic to treat area to right neck.  Patient reports that all symptoms started on 03/08/2021.  He reports that there has been no improvement in the swelling to right neck or any of his symptoms with amoxicillin antibiotic.  Denies any known fevers or sick contacts.  His employer is requiring a COVID test to return to work.  Patient has taken NyQuil for symptoms with minimal improvement.  Denies chest pain, shortness of breath, neck stiffness, headache, diarrhea, abdominal pain, sore throat.    Past Medical History:  Diagnosis Date   STD (sexually transmitted disease)     There are no problems to display for this patient.   History reviewed. No pertinent surgical history.     Home Medications    Prior to Admission medications   Medication Sig Start Date End Date Taking? Authorizing Provider  clindamycin (CLEOCIN) 150 MG capsule Take 2 capsules (300 mg total) by mouth 3 (three) times daily for 7 days. 03/11/21 03/18/21 Yes Ameia Morency, Michele Rockers, FNP  betamethasone dipropionate (DIPROLENE) 0.05 % cream Apply 1 application topically 2 (two) times daily.    [provider]  doxycycline (VIBRAMYCIN) 100 MG capsule Take 1 capsule (100 mg total) by mouth 2 (two) times daily. One po bid x 7 days 11/16/20   Jaynee Eagles, PA-C  naproxen (NAPROSYN) 500 MG tablet Take 1 tablet (500 mg total) by mouth 2 (two) times daily with a meal. 11/16/20   Jaynee Eagles, PA-C  ondansetron (ZOFRAN ODT) 4 MG disintegrating tablet Take 1 tablet (4 mg total) by mouth every 8 (eight) hours as needed for nausea or vomiting.  03/03/18   Deno Etienne, DO    Family History Family History  Problem Relation Age of Onset   Cancer Other     Social History Social History   Tobacco Use   Smoking status: Never   Smokeless tobacco: Never  Vaping Use   Vaping Use: Never used  Substance Use Topics   Alcohol use: Yes    Alcohol/week: 0.0 standard drinks    Comment: occ   Drug use: Never     Allergies   Patient has no known allergies.   Review of Systems Review of Systems Per HPI  Physical Exam Triage Vital Signs ED Triage Vitals  Enc Vitals Group     BP 03/11/21 0817 (!) 142/76     Pulse Rate 03/11/21 0817 76     Resp 03/11/21 0817 18     Temp 03/11/21 0817 98.9 F (37.2 C)     Temp Source 03/11/21 0817 Oral     SpO2 03/11/21 0817 97 %     Weight --      Height --      Head Circumference --      Peak Flow --      Pain Score 03/11/21 0819 8     Pain Loc --      Pain Edu? --      Excl. in [place]? --    No data found.  Updated Vital Signs BP (!) 142/76 (  BP Location: Left Arm)    Pulse 76    Temp 98.9 F (37.2 C) (Oral)    Resp 18    SpO2 97%   Visual Acuity Right Eye Distance:   Left Eye Distance:   Bilateral Distance:    Right Eye Near:   Left Eye Near:    Bilateral Near:     Physical Exam Constitutional:      General: He is not in acute distress.    Appearance: Normal appearance. He is not toxic-appearing or diaphoretic.  HENT:     Head: Normocephalic and atraumatic.     Right Ear: Tympanic membrane and ear canal normal.     Left Ear: Tympanic membrane and ear canal normal.     Nose: Congestion present.     Mouth/Throat:     Mouth: Mucous membranes are moist.     Pharynx: No posterior oropharyngeal erythema.  Eyes:     Extraocular Movements: Extraocular movements intact.     Conjunctiva/sclera: Conjunctivae normal.     Pupils: Pupils are equal, round, and reactive to light.  Cardiovascular:     Rate and Rhythm: Normal rate and regular rhythm.     Pulses: Normal pulses.      Heart sounds: Normal heart sounds.  Pulmonary:     Effort: Pulmonary effort is normal. No respiratory distress.     Breath sounds: Normal breath sounds. No stridor. No wheezing, rhonchi or rales.  Abdominal:     General: Abdomen is flat. Bowel sounds are normal.     Palpations: Abdomen is soft.  Musculoskeletal:        General: Normal range of motion.     Cervical back: Normal range of motion.  Lymphadenopathy:     Cervical: Cervical adenopathy present.     Comments: Patient has cervical swelling to right side of neck.  Skin:    General: Skin is warm and dry.  Neurological:     General: No focal deficit present.     Mental Status: He is alert and oriented to person, place, and time. Mental status is at baseline.  Psychiatric:        Mood and Affect: Mood normal.        Behavior: Behavior normal.     UC Treatments / Results  Labs (all labs ordered are listed, but only abnormal results are displayed) Labs Reviewed  COVID-19, FLU A+B NAA  CULTURE, GROUP A STREP ([facility])  POCT MONO SCREEN ([facility])  POCT RAPID STREP A (OFFICE)    EKG   Radiology No results found.  Procedures Procedures (including critical care time)  Medications Ordered in UC Medications - No data to display  Initial Impression / Assessment and Plan / UC Course  I have reviewed the triage vital signs and the nursing notes.  Pertinent labs & imaging results that were available during my care of the patient were reviewed by me and considered in my medical decision making (see chart for details).     Patient's symptoms appear viral in etiology.  Right neck lesion/swelling appears to be cervical lymphadenopathy.  Rapid mono was negative.  Rapid strep was negative.  Throat culture, COVID-19, flu test is pending.  Will stop amoxicillin.  Start clindamycin due to cervical lymphadenopathy.  Advised patient to go to the hospital if no improvement in lymph node swelling in the next 24 to 48 hours given the amount of  lymph node swelling that is present.  Patient is nontoxic-appearing at this time so do not  think that he needs immediate medical attention in the hospital at this time.  Discussed strict ER precautions.  Patient verbalized understanding and was agreeable with plan. Final Clinical Impressions(s) / UC Diagnoses   Final diagnoses:  Cervical lymphadenopathy  Acute cough  Acute upper respiratory infection     Discharge Instructions      Your rapid strep and rapid monotest were negative.  Throat culture, COVID-19, flu test are pending.  We will call if they are positive.  Please stop taking amoxicillin.  Start clindamycin.  Go the hospital if no improvement in right neck swelling in the next 24 to 48 hours.     ED Prescriptions     Medication Sig Dispense Auth. Provider   clindamycin (CLEOCIN) 150 MG capsule Take 2 capsules (300 mg total) by mouth 3 (three) times daily for 7 days. 42 capsule [place], Michele Rockers, [place]      PDMP not reviewed this encounter.   Teodora Medici, [place] 03/11/21 [place], [place], [place] 03/11/21 [phone]

## 2021-03-11 NOTE — Discharge Instructions (Signed)
Your rapid strep and rapid monotest were negative.  Throat culture, COVID-19, flu test are pending.  We will call if they are positive.  Please stop taking amoxicillin.  Start clindamycin.  Go the hospital if no improvement in right neck swelling in the next 24 to 48 hours.

## 2021-03-12 ENCOUNTER — Emergency Department (HOSPITAL_COMMUNITY): Payer: Commercial Managed Care - PPO

## 2021-03-12 ENCOUNTER — Other Ambulatory Visit: Payer: Self-pay

## 2021-03-12 ENCOUNTER — Emergency Department (HOSPITAL_COMMUNITY)
Admission: EM | Admit: 2021-03-12 | Discharge: 2021-03-12 | Disposition: A | Discharge status: Home / Self Care | Payer: Commercial Managed Care - PPO | Attending: Emergency Medicine | Admitting: Emergency Medicine

## 2021-03-12 ENCOUNTER — Encounter (HOSPITAL_COMMUNITY): Payer: Self-pay

## 2021-03-12 DIAGNOSIS — Z20822 Contact with and (suspected) exposure to covid-19: Secondary | ICD-10-CM | POA: Insufficient documentation

## 2021-03-12 DIAGNOSIS — R599 Enlarged lymph nodes, unspecified: Secondary | ICD-10-CM | POA: Diagnosis not present

## 2021-03-12 DIAGNOSIS — E876 Hypokalemia: Secondary | ICD-10-CM | POA: Diagnosis not present

## 2021-03-12 DIAGNOSIS — R509 Fever, unspecified: Secondary | ICD-10-CM | POA: Diagnosis present

## 2021-03-12 DIAGNOSIS — R590 Localized enlarged lymph nodes: Secondary | ICD-10-CM

## 2021-03-12 LAB — COMPREHENSIVE METABOLIC PANEL
ALT: 32 U/L (ref 0–44)
AST: 20 U/L (ref 15–41)
Albumin: 4.1 g/dL (ref 3.5–5.0)
Alkaline Phosphatase: 57 U/L (ref 38–126)
Anion gap: 11 (ref 5–15)
BUN: 14 mg/dL (ref 6–20)
CO2: 23 mmol/L (ref 22–32)
Calcium: 8.6 mg/dL — ABNORMAL LOW (ref 8.9–10.3)
Chloride: 98 mmol/L (ref 98–111)
Creatinine, Ser: 0.76 mg/dL (ref 0.61–1.24)
GFR, Estimated: >60 mL/min (ref >60)
Glucose, Bld: 120 mg/dL — ABNORMAL HIGH (ref 70–99)
Potassium: 3 mmol/L — ABNORMAL LOW (ref 3.5–5.1)
Sodium: 132 mmol/L — ABNORMAL LOW (ref 135–145)
Total Bilirubin: 1 mg/dL (ref 0.3–1.2)
Total Protein: 8.2 g/dL — ABNORMAL HIGH (ref 6.5–8.1)

## 2021-03-12 LAB — CBC WITH DIFFERENTIAL/PLATELET
Abs Immature Granulocytes: 0.04 10*3/uL (ref 0.00–0.07)
Basophils Absolute: 0.1 10*3/uL (ref 0.0–0.1)
Basophils Relative: 1 %
Eosinophils Absolute: 0 10*3/uL (ref 0.0–0.5)
Eosinophils Relative: 0 %
HCT: 39.6 % (ref 39.0–52.0)
Hemoglobin: 14.3 g/dL (ref 13.0–17.0)
Immature Granulocytes: 0 %
Lymphocytes Relative: 8 %
Lymphs Abs: 1 10*3/uL (ref 0.7–4.0)
MCH: 34.2 pg — ABNORMAL HIGH (ref 26.0–34.0)
MCHC: 36.1 g/dL — ABNORMAL HIGH (ref 30.0–36.0)
MCV: 94.7 fL (ref 80.0–100.0)
Monocytes Absolute: 2.1 10*3/uL — ABNORMAL HIGH (ref 0.1–1.0)
Monocytes Relative: 17 %
Neutro Abs: 9.2 10*3/uL — ABNORMAL HIGH (ref 1.7–7.7)
Neutrophils Relative %: 74 %
Platelets: 227 10*3/uL (ref 150–400)
RBC: 4.18 MIL/uL — ABNORMAL LOW (ref 4.22–5.81)
RDW: 11.6 % (ref 11.5–15.5)
WBC: 12.4 10*3/uL — ABNORMAL HIGH (ref 4.0–10.5)
nRBC: 0 % (ref 0.0–0.2)

## 2021-03-12 LAB — RESP PANEL BY RT-PCR (FLU A&B, COVID) ARPGX2
Influenza A by PCR: NEGATIVE
Influenza B by PCR: NEGATIVE
SARS Coronavirus 2 by RT PCR: NEGATIVE

## 2021-03-12 LAB — COVID-19, FLU A+B NAA
Influenza A, NAA: NOT DETECTED
Influenza B, NAA: NOT DETECTED
SARS-CoV-2, NAA: NOT DETECTED

## 2021-03-12 MED ORDER — ACETAMINOPHEN 325 MG PO TABS
650.0000 mg | ORAL_TABLET | Freq: Once | ORAL | Status: AC | PRN
  Administered 2021-03-12: 650 mg via ORAL
  Filled 2021-03-12: qty 2

## 2021-03-12 MED ORDER — IOHEXOL 350 MG/ML SOLN
60.0000 mL | Freq: Once | INTRAVENOUS | Status: AC | PRN
Start: 2021-03-12 — End: 2021-03-12
  Administered 2021-03-12: 60 mL via INTRAVENOUS

## 2021-03-12 MED ORDER — POTASSIUM CHLORIDE CRYS ER 20 MEQ PO TBCR
40.0000 meq | EXTENDED_RELEASE_TABLET | Freq: Once | ORAL | Status: AC
  Administered 2021-03-12: 40 meq via ORAL
  Filled 2021-03-12: qty 2

## 2021-03-12 NOTE — ED Triage Notes (Signed)
Patient states he has had swollen lymph glands on the right neck, cough, fever, emesis, and chills x 4-5 days. Patient states he has been to [facility] UC twice. Patient has been on antibiotics.  T. 103.2.

## 2021-03-12 NOTE — ED Provider Notes (Signed)
[facility]-EMERGENCY DEPT Provider Note   [name]: [id number] Arrival date & time: 03/12/21  1326     History  Chief Complaint  Patient presents with   Lymphadenopathy   Fever   Emesis   Chills   Cough    Derek Warren is a 26 y.o. male here presenting with right-sided neck pain.  Patient was seen at urgent care on 11/7 for chills and right-sided neck pain.  Patient was prescribed amoxicillin at that time.  Patient then went to urgent care yesterday because he has worsening swelling and persistent fever.  Patient was prescribed clindamycin at that time.  His COVID and flu and mono and strep test were negative.  Patient states that he has persistent swelling so came here for evaluation  The history is provided by the patient.      Home Medications Prior to Admission medications   Medication Sig Start Date End Date Taking? Authorizing Provider  clindamycin (CLEOCIN) 150 MG capsule Take 2 capsules (300 mg total) by mouth 3 (three) times daily for 7 days. 03/11/21 03/18/21 Yes [place], Acie Fredrickson, FNP  doxycycline (VIBRAMYCIN) 100 MG capsule Take 1 capsule (100 mg total) by mouth 2 (two) times daily. One po bid x 7 days 11/16/20  Yes Wallis Bamberg, PA-C  naproxen (NAPROSYN) 500 MG tablet Take 1 tablet (500 mg total) by mouth 2 (two) times daily with a meal. Patient not taking: Reported on 03/12/2021 11/16/20   Wallis Bamberg, PA-C  ondansetron (ZOFRAN ODT) 4 MG disintegrating tablet Take 1 tablet (4 mg total) by mouth every 8 (eight) hours as needed for nausea or vomiting. Patient not taking: Reported on 03/12/2021 03/03/18   Melene Plan, DO      Allergies    Patient has no known allergies.    Review of Systems   Review of Systems  Constitutional:  Positive for fever.  Respiratory:  Positive for cough.   Gastrointestinal:  Positive for vomiting.  All other systems reviewed and are negative.  Physical Exam Updated Vital Signs BP 122/72    Pulse 93    Temp (!) 100.5 F (38.1  C) (Oral)    Resp 18    Ht 5\' 10"  (1.778 m)    Wt 63.5 kg    SpO2 100%    BMI 20.09 kg/m  Physical Exam Vitals and nursing note reviewed.  Constitutional:      Comments: Well-appearing  HENT:     Head: Normocephalic.     Nose: Nose normal.     Mouth/Throat:     Mouth: Mucous membranes are moist.     Comments: Oropharynx is clear.  There is no obvious swollen tonsils or exudates.  Uvula is midline. Eyes:     Extraocular Movements: Extraocular movements intact.     Pupils: Pupils are equal, round, and reactive to light.  Neck:     Comments: Some redness on the right side of his neck.  Mild right lymphadenopathy Cardiovascular:     Rate and Rhythm: Normal rate and regular rhythm.     Pulses: Normal pulses.     Heart sounds: Normal heart sounds.  Pulmonary:     Effort: Pulmonary effort is normal.     Breath sounds: Normal breath sounds.  Abdominal:     General: Abdomen is flat.     Palpations: Abdomen is soft.  Musculoskeletal:        General: Normal range of motion.     Cervical back: Normal range of motion.  Skin:    General: Skin is warm.     Capillary Refill: Capillary refill takes less than 2 seconds.  Neurological:     General: No focal deficit present.     Mental Status: He is alert and oriented to person, place, and time.  Psychiatric:        Mood and Affect: Mood normal.        Behavior: Behavior normal.    ED Results / Procedures / Treatments   Labs (all labs ordered are listed, but only abnormal results are displayed) Labs Reviewed  COMPREHENSIVE METABOLIC PANEL - Abnormal; Notable for the following components:      Result Value   Sodium 132 (*)    Potassium 3.0 (*)    Glucose, Bld 120 (*)    Calcium 8.6 (*)    Total Protein 8.2 (*)    All other components within normal limits  CBC WITH DIFFERENTIAL/PLATELET - Abnormal; Notable for the following components:   WBC 12.4 (*)    RBC 4.18 (*)    MCH 34.2 (*)    MCHC 36.1 (*)    Neutro Abs 9.2 (*)     Monocytes Absolute 2.1 (*)    All other components within normal limits  RESP PANEL BY RT-PCR (FLU A&B, COVID) [id number]    EKG None  Radiology DG Chest 2 View  Result Date: 03/12/2021 CLINICAL DATA:  Cough, concern for cervical adenopathy, fever, chills EXAM: CHEST - 2 VIEW COMPARISON:  12/28/2012 FINDINGS: The heart size and mediastinal contours are within normal limits. Both lungs are clear. The visualized skeletal structures are unremarkable. IMPRESSION: No active cardiopulmonary disease. Electronically Signed   By: Judie PetitM.  Shick M.D.   On: 03/12/2021 15:02   CT Soft Tissue Neck W Contrast  Result Date: 03/12/2021 CLINICAL DATA:  Soft tissue swelling, infection suspected EXAM: CT NECK WITH CONTRAST TECHNIQUE: Multidetector CT imaging of the neck was performed using the standard protocol following the bolus administration of intravenous contrast. RADIATION DOSE REDUCTION: This exam was performed according to the departmental dose-optimization program which includes automated exposure control, adjustment of the mA and/or kV according to patient size and/or use of iterative reconstruction technique. CONTRAST:  60mL OMNIPAQUE IOHEXOL 350 MG/ML SOLN COMPARISON:  None. FINDINGS: Motion and streak artifact present on some slices. Pharynx and larynx: Unremarkable.  No mass or swelling. Salivary glands: Parotid and submandibular glands are unremarkable. Thyroid: Normal. Lymph nodes: Asymmetric increased number of right internal jugular, posterior triangle, and supraclavicular lymph nodes. Several of these are enlarged. For example, right level 5A node measuring 1.7 cm on series 2, image 46. There is also a nonenlarged but asymmetric right suboccipital superficial node. Vascular: Major neck vessels are patent. Limited intracranial: No abnormal enhancement. Visualized orbits: Unremarkable. Mastoids and visualized paranasal sinuses: No significant opacification. Skeleton: No significant osseous abnormality. Upper  chest: Included lungs are clear. Other: Right posterior neck soft tissue swelling with skin thickening and infiltration of superficial and deep subcutaneous fat in the posterior triangle. No evidence of abscess. IMPRESSION: Asymmetric right sided adenopathy with posterior triangle and supraclavicular inflammatory changes. No abscess. Electronically Signed   By: Guadlupe SpanishPraneil  Patel M.D.   On: 03/12/2021 16:17    Procedures Procedures    Medications Ordered in ED Medications  acetaminophen (TYLENOL) tablet 650 mg (650 mg Oral Given 03/12/21 1428)  iohexol (OMNIPAQUE) 350 MG/ML injection 60 mL (60 mLs Intravenous Contrast Given 03/12/21 1556)  potassium chloride SA (KLOR-CON M) CR tablet 40 mEq (40 mEq Oral Given  03/12/21 1853)    ED Course/ Medical Decision Making/ A&P                           Medical Decision Making Derek Warren is a 26 y.o. male here presenting with fever and cough and right-sided neck pain.  We will get CBC and CMP and CT neck to rule out mass versus deep space infection.   6:59 PM White blood cell count is 12.  Potassium is 3.0 and was supplemented.  CT showed right lymphadenopathy with no obvious mass or abscess.  Patient is already on clindamycin.  I told him that he should finish his clindamycin and follow-up with the ENT as needed   Amount and/or Complexity of Data Reviewed External Data Reviewed: labs, radiology and notes. Labs: ordered. Decision-making details documented in ED Course. Radiology: ordered and independent interpretation performed.    Details: R lymphadenopathy   Final Clinical Impression(s) / ED Diagnoses Final diagnoses:  None    Rx / DC Orders ED Discharge Orders     None         Charlynne Pander, MD 03/12/21 1901

## 2021-03-12 NOTE — ED Provider Triage Note (Signed)
Emergency Medicine Provider Triage Evaluation Note  Derek Warren , a 26 y.o. male  was evaluated in triage.  Pt complains of right-sided neck swelling, nonproductive cough, fever, nausea, vomiting, and chills.  Patient states that symptoms started last week and have been worsening since then.  Patient states that he has been seen at [facility] urgent care twice.  Patient was recently started on clindamycin and had negative Monospot and rapid strep test.  Review of Systems  Positive: Fever, nausea, vomiting, chills, nonproductive cough, swelling to rest of neck Negative: Sore throat, drooling, trismus, neck stiffness, abdominal pain, dysuria, hematuria, urinary frequency  Physical Exam  BP (!) 139/91    Pulse (!) 108    Temp (!) 103.2 F (39.6 C) (Oral)    Resp 20    Ht 5\' 10"  (1.778 m)    Wt 63.5 kg    SpO2 98%    BMI 20.09 kg/m  Gen:   Awake, no distress   Resp:  Normal effort, lungs clear to auscultation bilaterally MSK:   Moves extremities without difficulty  Other:  Abdomen soft, nondistended, nontender.  Patient has swelling to right side of his neck.  Patient has full range of motion to neck and neck is supple.  No swelling or erythema noted to tonsils bilaterally.  Erythema noted to posterior oropharynx.  Patient handles oral secretions without difficulty.  Medical Decision Making  Medically screening exam initiated at 2:36 PM.  Appropriate orders placed.  Derek Warren was informed that the remainder of the evaluation will be completed by another provider, this initial triage assessment does not replace that evaluation, and the importance of remaining in the ED until their evaluation is complete.  Patient noted to be febrile.  Given 650mg  tylenol   Loni Beckwith, PA-C 03/12/21 1437

## 2021-03-12 NOTE — Discharge Instructions (Addendum)
Your CT scan did not show any mass but just swollen lymph node on the right side  Please finish your clindamycin as prescribed   Stay hydrated.  Take Tylenol or Motrin for fever  Consider following up with the ENT doctor if you have persistent swelling in your neck.  Return to ER if you have worse neck swelling, trouble swallowing, fever

## 2021-03-14 LAB — CULTURE, GROUP A STREP (THRC)

## 2021-04-01 DIAGNOSIS — R590 Localized enlarged lymph nodes: Secondary | ICD-10-CM | POA: Insufficient documentation

## 2021-04-26 ENCOUNTER — Other Ambulatory Visit: Payer: Self-pay

## 2021-04-26 ENCOUNTER — Ambulatory Visit
Admission: EM | Admit: 2021-04-26 | Discharge: 2021-04-26 | Disposition: A | Discharge status: Home / Self Care | Payer: Commercial Managed Care - PPO | Attending: Internal Medicine | Admitting: Internal Medicine

## 2021-04-26 DIAGNOSIS — Z113 Encounter for screening for infections with a predominantly sexual mode of transmission: Secondary | ICD-10-CM | POA: Diagnosis present

## 2021-04-26 NOTE — ED Provider Notes (Signed)
[name]    [name]: [id number] Arrival date & time: 04/26/21  0804      History   Chief Complaint Chief Complaint  Patient presents with   sti testing    HPI Derek Warren is a 26 y.o. male.   Patient presents for routine STD testing.  He denies any current symptoms including urinary burning, urinary frequency, penile discharge, testicular pain.  Denies any recent known exposures.  Patient denies that he wants blood work for HIV or syphilis.  He also reports that he has had some blood from his rectum only when wiping that occurred a few weeks ago.  Denies any pain or trauma to the rectum.  Denies any known hemorrhoids.  Blood was a small amount and only occurred when he wiped 1-2 times and is now resolved.    Past Medical History:  Diagnosis Date   STD (sexually transmitted disease)     There are no problems to display for this patient.   History reviewed. No pertinent surgical history.     Home Medications    Prior to Admission medications   Medication Sig Start Date End Date Taking? Authorizing Provider  doxycycline (VIBRAMYCIN) 100 MG capsule Take 1 capsule (100 mg total) by mouth 2 (two) times daily. One po bid x 7 days 11/16/20   Jaynee Eagles, PA-C  naproxen (NAPROSYN) 500 MG tablet Take 1 tablet (500 mg total) by mouth 2 (two) times daily with a meal. Patient not taking: Reported on 03/12/2021 11/16/20   Jaynee Eagles, PA-C  ondansetron (ZOFRAN ODT) 4 MG disintegrating tablet Take 1 tablet (4 mg total) by mouth every 8 (eight) hours as needed for nausea or vomiting. Patient not taking: Reported on 03/12/2021 03/03/18   Deno Etienne, DO    Family History Family History  Problem Relation Age of Onset   Cancer Other     Social History Social History   Tobacco Use   Smoking status: Never   Smokeless tobacco: Never  Vaping Use   Vaping Use: Never used  Substance Use Topics   Alcohol use: Yes    Alcohol/week: 0.0 standard drinks    Comment: occ    Drug use: Never     Allergies   Patient has no known allergies.   Review of Systems Review of Systems Per HPI  Physical Exam Triage Vital Signs ED Triage Vitals  Enc Vitals Group     BP 04/26/21 0816 124/74     Pulse Rate 04/26/21 0816 66     Resp 04/26/21 0816 18     Temp 04/26/21 0816 98.2 F (36.8 C)     Temp Source 04/26/21 0816 Oral     SpO2 04/26/21 0816 98 %     Weight --      Height --      Head Circumference --      Peak Flow --      Pain Score 04/26/21 0818 0     Pain Loc --      Pain Edu? --      Excl. in [place]? --    No data found.  Updated Vital Signs BP 124/74 (BP Location: Left Arm)    Pulse 66    Temp 98.2 F (36.8 C) (Oral)    Resp 18    SpO2 98%   Visual Acuity Right Eye Distance:   Left Eye Distance:   Bilateral Distance:    Right Eye Near:   Left Eye Near:    Bilateral  Near:     Physical Exam Constitutional:      General: He is not in acute distress.    Appearance: Normal appearance. He is not toxic-appearing or diaphoretic.  HENT:     Head: Normocephalic and atraumatic.  Eyes:     Extraocular Movements: Extraocular movements intact.     Conjunctiva/sclera: Conjunctivae normal.  Pulmonary:     Effort: Pulmonary effort is normal.  Genitourinary:    Comments: Deferred with shared decision making.  Self swab performed. Neurological:     General: No focal deficit present.     Mental Status: He is alert and oriented to person, place, and time. Mental status is at baseline.  Psychiatric:        Mood and Affect: Mood normal.        Behavior: Behavior normal.        Thought Content: Thought content normal.        Judgment: Judgment normal.     UC Treatments / Results  Labs (all labs ordered are listed, but only abnormal results are displayed) Labs Reviewed  CYTOLOGY, (ORAL, ANAL, URETHRAL) ANCILLARY ONLY    EKG   Radiology No results found.  Procedures Procedures (including critical care time)  Medications Ordered in  UC Medications - No data to display  Initial Impression / Assessment and Plan / UC Course  I have reviewed the triage vital signs and the nursing notes.  Pertinent labs & imaging results that were available during my care of the patient were reviewed by me and considered in my medical decision making (see chart for details).     Cytology swab pending per patient request for routine STD testing.  Patient advised to refrain from sexual activity until test results and treatment are complete.  Suspect that blood that is occurring when patient is wiping could be from irritation or possible hemorrhoids.  It is now resolved so advised patient to monitor and to follow-up if it returns.  Patient is also hemodynamically stable so no concern that there is worrisome etiology at this time.  Discussed return precautions.  Patient verbalized understanding and was agreeable with plan. Final Clinical Impressions(s) / UC Diagnoses   Final diagnoses:  Screening examination for venereal disease     Discharge Instructions      Your penile swab is pending.  We will call if it is positive and treat as appropriate.  Please refrain from sexual activity until test results and treatment are complete.    ED Prescriptions   None    PDMP not reviewed this encounter.   Teodora Medici, [place] 04/26/21 [phone]

## 2021-04-26 NOTE — Discharge Instructions (Signed)
Your penile swab is pending.  We will call if it is positive and treat as appropriate.  Please refrain from sexual activity until test results and treatment are complete. °

## 2021-04-26 NOTE — ED Triage Notes (Signed)
Pt here for sti checkup. Denies dysuria, discharge, skin lesions, itching or burning. Denies exposure or being notified of exposure.   Pt continued to states one time "a couple of weeks ago" he noticed blood on the tissue after wiping his rectum.

## 2021-04-29 LAB — CYTOLOGY, (ORAL, ANAL, URETHRAL) ANCILLARY ONLY
Chlamydia: NEGATIVE
Comment: NEGATIVE
Comment: NEGATIVE
Comment: NORMAL
Neisseria Gonorrhea: NEGATIVE
Trichomonas: NEGATIVE

## 2021-04-30 ENCOUNTER — Other Ambulatory Visit: Payer: Self-pay

## 2021-04-30 ENCOUNTER — Encounter (HOSPITAL_COMMUNITY): Payer: Self-pay | Admitting: Emergency Medicine

## 2021-04-30 ENCOUNTER — Ambulatory Visit (HOSPITAL_COMMUNITY)
Admission: EM | Admit: 2021-04-30 | Discharge: 2021-04-30 | Disposition: A | Discharge status: Home / Self Care | Payer: Commercial Managed Care - PPO | Attending: Urgent Care | Admitting: Urgent Care

## 2021-04-30 DIAGNOSIS — K602 Anal fissure, unspecified: Secondary | ICD-10-CM

## 2021-04-30 MED ORDER — DOCUSATE SODIUM 100 MG PO CAPS: 100.0000 mg | ORAL_CAPSULE | Freq: Two times a day (BID) | ORAL | 0 refills | Status: DC

## 2021-04-30 MED ORDER — LIDOCAINE (ANORECTAL) 5 % EX GEL: CUTANEOUS | 0 refills | Status: DC

## 2021-04-30 NOTE — Discharge Instructions (Signed)
For moderate to severe constipation (not having a bowel movement in more than 3 days) then try to use an enema or Miralax once daily until you have a good bowel movement.  It is not a good idea to use an enema or laxatives daily. If you find you are doing this, then please follow up with a gastroenterologist. Otherwise, a medication you could use daily to help with promoting bowel movements is docusate (Colace) 100mg. It is okay to use this 1-2 times daily as a stool softener.  Try to stay active physically including regular exercise 2-3 times a week.  Make sure you hydrate well every day with about 64 ounces of water daily (that is 2 liters).  Try to avoid carb heavy foods, dairy. This includes cutting out breads, pasta, pizza, pastries, potatoes, rice, starchy foods in general. Eat more fiber as listed below:  Salads - kale, spinach, cabbage, spring mix, arugula Fruits - avocadoes, berries (blueberries, raspberries, blackberries), apples, oranges, pomegranate, grapefruit, kiwi Vegetables - asparagus, cauliflower, broccoli, green beans, brussel sprouts, bell peppers, beets; stay away from or limit starchy vegetables like potatoes, carrots, peas Other general foods - kidney beans, egg whites, almonds, walnuts, sunflower seeds, pumpkin seeds, fat free yogurt, almond milk, flax seeds, quinoa, oats  Meat - It is better to eat lean meats and limit your red meat including pork to once a week.  Wild caught fish, chicken breast are good options as they tend to be leaner sources of good protein. Still be mindful of the sodium labels for the meats you buy.  DO NOT EAT ANY FOODS ON THIS LIST THAT YOU ARE ALLERGIC TO. For more specific needs, I highly recommend consulting a dietician or nutritionist but this can definitely be a good starting point.  

## 2021-04-30 NOTE — ED Provider Notes (Signed)
[place]   MRN: [id number] DOB: 1995-12-31  Subjective:   Derek Warren is a 26 y.o. male presenting for 2-week history of persistent intermittent mild perianal pain, slight bleeding when he wipes after defecating.  Patient has had a difficult time with constipation of the past 2 weeks.  He is not defecating every day and has really hard stools.  Has to strain a lot as well.  Has not felt any particular masses.  Has not seen blood in the stool itself.  No history of GI issues, Crohn's, ulcerative colitis.  No fever, nausea, vomiting.  Patient admits that he does not eat healthily, eats a lot of takeout and very little fiber.  No current facility-administered medications for this encounter.  Current Outpatient Medications:    doxycycline (VIBRAMYCIN) 100 MG capsule, Take 1 capsule (100 mg total) by mouth 2 (two) times daily. One po bid x 7 days, Disp: 20 capsule, Rfl: 0   naproxen (NAPROSYN) 500 MG tablet, Take 1 tablet (500 mg total) by mouth 2 (two) times daily with a meal. (Patient not taking: Reported on 03/12/2021), Disp: 30 tablet, Rfl: 0   ondansetron (ZOFRAN ODT) 4 MG disintegrating tablet, Take 1 tablet (4 mg total) by mouth every 8 (eight) hours as needed for nausea or vomiting. (Patient not taking: Reported on 03/12/2021), Disp: 20 tablet, Rfl: 0   No Known Allergies  Past Medical History:  Diagnosis Date   STD (sexually transmitted disease)      No past surgical history on file.  Family History  Problem Relation Age of Onset   Cancer Other     Social History   Tobacco Use   Smoking status: Never   Smokeless tobacco: Never  Vaping Use   Vaping Use: Never used  Substance Use Topics   Alcohol use: Yes    Alcohol/week: 0.0 standard drinks    Comment: occ   Drug use: Never    ROS   Objective:   Vitals: BP (!) 146/75 (BP Location: Right Arm)    Pulse 68    Temp 98 F (36.7 C) (Oral)    Resp 17    Ht 5\' 10"  (1.778 m)    Wt 139 lb 15.9 oz  (63.5 kg)    SpO2 98%    BMI 20.09 kg/m   Physical Exam Constitutional:      General: He is not in acute distress.    Appearance: Normal appearance. He is well-developed and normal weight. He is not ill-appearing, toxic-appearing or diaphoretic.  HENT:     Head: Normocephalic and atraumatic.     Right Ear: External ear normal.     Left Ear: External ear normal.     Nose: Nose normal.     Mouth/Throat:     Pharynx: Oropharynx is clear.  Eyes:     General: No scleral icterus.       Right eye: No discharge.        Left eye: No discharge.     Extraocular Movements: Extraocular movements intact.  Cardiovascular:     Rate and Rhythm: Normal rate.  Pulmonary:     Effort: Pulmonary effort is normal.  Genitourinary:    Rectum: Anal fissure (over area outlined) present. No tenderness or external hemorrhoid.    Musculoskeletal:     Cervical back: Normal range of motion.  Neurological:     Mental Status: He is alert and oriented to person, place, and time.  Psychiatric:  Mood and Affect: Mood normal.        Behavior: Behavior normal.        Thought Content: Thought content normal.        Judgment: Judgment normal.    Assessment and Plan :   PDMP not reviewed this encounter.  1. Anal fissure    The primary issue for patient is his poor diet and also constipation.  Recommended conservative management for his anal fissure and emphasized need for significant dietary modifications.  In the meantime, advised that he start docusate as a stool softener and use lidocaine gel for local pain relief. Counseled patient on potential for adverse effects with medications prescribed/recommended today, ER and return-to-clinic precautions discussed, patient verbalized understanding.    Jaynee Eagles, [place] 04/30/21 1118

## 2021-04-30 NOTE — ED Triage Notes (Signed)
Pt believes he has hemorrhoids and wants to be "checked out" Pt reports pain in the anal area and constipation x 2 weeks.

## 2021-08-06 ENCOUNTER — Encounter: Payer: Self-pay | Admitting: Emergency Medicine

## 2021-08-06 ENCOUNTER — Ambulatory Visit
Admission: EM | Admit: 2021-08-06 | Discharge: 2021-08-06 | Disposition: A | Discharge status: Home / Self Care | Payer: Commercial Managed Care - PPO | Attending: Student | Admitting: Student

## 2021-08-06 ENCOUNTER — Other Ambulatory Visit: Payer: Self-pay

## 2021-08-06 DIAGNOSIS — B349 Viral infection, unspecified: Secondary | ICD-10-CM | POA: Insufficient documentation

## 2021-08-06 DIAGNOSIS — Z113 Encounter for screening for infections with a predominantly sexual mode of transmission: Secondary | ICD-10-CM | POA: Insufficient documentation

## 2021-08-06 MED ORDER — ONDANSETRON 8 MG PO TBDP: 8.0000 mg | ORAL_TABLET | Freq: Three times a day (TID) | ORAL | 0 refills | Status: DC | PRN

## 2021-08-06 NOTE — ED Triage Notes (Signed)
Pt here for nasal congestion and body aches x 2 days with nausea today

## 2021-08-06 NOTE — ED Provider Notes (Signed)
[facility]    [name]: [id number] Arrival date & time: 08/06/21  1915      History   Chief Complaint Chief Complaint  Patient presents with   Generalized Body Aches    HPI [name] is a 26 y.o. male presenting with stomach bug for 2 days, and requesting incidental STI screen.  History noncontributory.  Describes nausea with 1 episode of vomiting today, few episodes of watery diarrhea.  Occasional nonproductive cough.  Denies penile symptoms, but is requesting STI screening due to new male partner.  Tolerating fluids and some fluids.  HPI  Past Medical History:  Diagnosis Date   STD (sexually transmitted disease)     There are no problems to display for this patient.   History reviewed. No pertinent surgical history.     Home Medications    Prior to Admission medications   Medication Sig Start Date End Date Taking? Authorizing Provider  ondansetron (ZOFRAN-ODT) 8 MG disintegrating tablet Take 1 tablet (8 mg total) by mouth every 8 (eight) hours as needed for nausea or vomiting. 08/06/21  Yes Hazel Sams, PA-C  docusate sodium (COLACE) 100 MG capsule Take 1 capsule (100 mg total) by mouth every 12 (twelve) hours. 04/30/21   Jaynee Eagles, PA-C  Lidocaine, Anorectal, 5 % GEL Apply a pea-sized amount to the affected area 3 times daily as needed. 04/30/21   Jaynee Eagles, PA-C    Family History Family History  Problem Relation Age of Onset   Cancer Other     Social History Social History   Tobacco Use   Smoking status: Never   Smokeless tobacco: Never  Vaping Use   Vaping Use: Never used  Substance Use Topics   Alcohol use: Yes    Alcohol/week: 0.0 standard drinks    Comment: occ   Drug use: Never     Allergies   Patient has no known allergies.   Review of Systems Review of Systems  Constitutional:  Negative for chills and fever.  HENT:  Negative for sore throat.   Eyes:  Negative for pain and redness.  Respiratory:  Negative for  shortness of breath.   Cardiovascular:  Negative for chest pain.  Gastrointestinal:  Negative for abdominal pain, diarrhea, nausea and vomiting.  Genitourinary:  Negative for decreased urine volume, difficulty urinating, dysuria, flank pain, frequency, genital sores, hematuria, penile discharge, penile pain, penile swelling, scrotal swelling, testicular pain and urgency.  Musculoskeletal:  Negative for back pain.  Skin:  Negative for rash.  All other systems reviewed and are negative.   Physical Exam Triage Vital Signs ED Triage Vitals  Enc Vitals Group     BP 08/06/21 1935 129/88     Pulse Rate 08/06/21 1935 79     Resp 08/06/21 1935 18     Temp 08/06/21 1935 98.3 F (36.8 C)     Temp Source 08/06/21 1935 Oral     SpO2 08/06/21 1935 97 %     Weight --      Height --      Head Circumference --      Peak Flow --      Pain Score 08/06/21 1936 5     Pain Loc --      Pain Edu? --      Excl. in [place]? --    No data found.  Updated Vital Signs BP 129/88 (BP Location: Left Arm)   Pulse 79   Temp 98.3 F (36.8 C) (Oral)   Resp  18   SpO2 97%   Visual Acuity Right Eye Distance:   Left Eye Distance:   Bilateral Distance:    Right Eye Near:   Left Eye Near:    Bilateral Near:     Physical Exam Vitals reviewed.  Constitutional:      General: He is not in acute distress.    Appearance: Normal appearance. He is not ill-appearing.  HENT:     Head: Normocephalic and atraumatic.     Mouth/Throat:     Mouth: Mucous membranes are moist.     Comments: Moist mucous membranes Eyes:     Extraocular Movements: Extraocular movements intact.     Pupils: Pupils are equal, round, and reactive to light.  Cardiovascular:     Rate and Rhythm: Normal rate and regular rhythm.     Heart sounds: Normal heart sounds.  Pulmonary:     Effort: Pulmonary effort is normal.     Breath sounds: Normal breath sounds. No wheezing, rhonchi or rales.  Abdominal:     General: Bowel sounds are normal.  There is no distension.     Palpations: Abdomen is soft. There is no mass.     Tenderness: There is no abdominal tenderness. There is no right CVA tenderness, left CVA tenderness, guarding or rebound.  Genitourinary:    Comments: deferred Skin:    General: Skin is warm.     Capillary Refill: Capillary refill takes less than 2 seconds.     Comments: Good skin turgor  Neurological:     General: No focal deficit present.     Mental Status: He is alert and oriented to person, place, and time.  Psychiatric:        Mood and Affect: Mood normal.        Behavior: Behavior normal.     UC Treatments / Results  Labs (all labs ordered are listed, but only abnormal results are displayed) Labs Reviewed  CYTOLOGY, (ORAL, ANAL, URETHRAL) ANCILLARY ONLY    EKG   Radiology No results found.  Procedures Procedures (including critical care time)  Medications Ordered in UC Medications - No data to display  Initial Impression / Assessment and Plan / UC Course  I have reviewed the triage vital signs and the nursing notes.  Pertinent labs & imaging results that were available during my care of the patient were reviewed by me and considered in my medical decision making (see chart for details).     This patient is a very pleasant 26 y.o. year old male presenting with viral syndrome. Afebrile, nontachy, appears well hydrated.   Zofran sent. Good hydration, BRAT diet  Self swab sent for gonorrhea, chlamydia, trichomonas..   ED return precautions discussed. Patient verbalizes understanding and agreement.    Final Clinical Impressions(s) / UC Diagnoses   Final diagnoses:  Viral syndrome  Routine screening for STI (sexually transmitted infection)     Discharge Instructions      -Take the Zofran (ondansetron) up to 3 times daily for nausea and vomiting. Dissolve one pill under your tongue or between your teeth and your cheek. -You can purchase Imodium (loperamide) over-the-counter.  Take the Imodium (loperamide) up to 4 times daily for diarrhea. -We have sent testing for sexually transmitted infections. We will notify you of any positive results once they are received. If required, we will prescribe any medications you might need. Please refrain from all sexual activity until treatment is complete.  -Seek additional medical attention if you develop fevers/chills, new/worsening abdominal  pain, new/worsening penile discomfort/discharge, etc.     ED Prescriptions     Medication Sig Dispense Auth. Provider   ondansetron (ZOFRAN-ODT) 8 MG disintegrating tablet Take 1 tablet (8 mg total) by mouth every 8 (eight) hours as needed for nausea or vomiting. 20 tablet Hazel Sams, PA-C      PDMP not reviewed this encounter.   Hazel Sams, PA-C 08/06/21 1951

## 2021-08-06 NOTE — Discharge Instructions (Addendum)
-  Take the Zofran (ondansetron) up to 3 times daily for nausea and vomiting. Dissolve one pill under your tongue or between your teeth and your cheek. -You can purchase Imodium (loperamide) over-the-counter. Take the Imodium (loperamide) up to 4 times daily for diarrhea. -We have sent testing for sexually transmitted infections. We will notify you of any positive results once they are received. If required, we will prescribe any medications you might need. Please refrain from all sexual activity until treatment is complete.  -Seek additional medical attention if you develop fevers/chills, new/worsening abdominal pain, new/worsening penile discomfort/discharge, etc.

## 2021-08-08 LAB — CYTOLOGY, (ORAL, ANAL, URETHRAL) ANCILLARY ONLY
Chlamydia: NEGATIVE
Comment: NEGATIVE
Comment: NEGATIVE
Comment: NORMAL
Neisseria Gonorrhea: NEGATIVE
Trichomonas: NEGATIVE

## 2022-04-04 ENCOUNTER — Encounter: Payer: Self-pay | Admitting: Physician Assistant

## 2022-04-04 ENCOUNTER — Ambulatory Visit
Admission: EM | Admit: 2022-04-04 | Discharge: 2022-04-04 | Disposition: A | Discharge status: Home / Self Care | Payer: 59 | Attending: Physician Assistant | Admitting: Physician Assistant

## 2022-04-04 DIAGNOSIS — Z113 Encounter for screening for infections with a predominantly sexual mode of transmission: Secondary | ICD-10-CM | POA: Insufficient documentation

## 2022-04-04 NOTE — ED Triage Notes (Signed)
Patient presents to [facility] for STD check. No symptoms. Also requesting blood work.

## 2022-04-04 NOTE — ED Provider Notes (Signed)
[name]    [name]: [id number] Arrival date & time: 04/04/22  9381      History   Chief Complaint Chief Complaint  Patient presents with   SEXUALLY TRANSMITTED DISEASE    HPI Derek Warren is a 27 y.o. male.   Patient here today for STD screening. He denies any symptoms. He denies any known exposures.   The history is provided by the patient.    Past Medical History:  Diagnosis Date   STD (sexually transmitted disease)     There are no problems to display for this patient.   History reviewed. No pertinent surgical history.     Home Medications    Prior to Admission medications   Medication Sig Start Date End Date Taking? Authorizing Provider  docusate sodium (COLACE) 100 MG capsule Take 1 capsule (100 mg total) by mouth every 12 (twelve) hours. 04/30/21   Jaynee Eagles, PA-C  Lidocaine, Anorectal, 5 % GEL Apply a pea-sized amount to the affected area 3 times daily as needed. 04/30/21   Jaynee Eagles, PA-C  ondansetron (ZOFRAN-ODT) 8 MG disintegrating tablet Take 1 tablet (8 mg total) by mouth every 8 (eight) hours as needed for nausea or vomiting. 08/06/21   Hazel Sams, PA-C    Family History Family History  Problem Relation Age of Onset   Cancer Other     Social History Social History   Tobacco Use   Smoking status: Never   Smokeless tobacco: Never  Vaping Use   Vaping Use: Never used  Substance Use Topics   Alcohol use: Yes    Alcohol/week: 0.0 standard drinks of alcohol    Comment: occ   Drug use: Never     Allergies   Patient has no known allergies.   Review of Systems Review of Systems  Constitutional:  Negative for chills and fever.  Eyes:  Negative for discharge and redness.  Respiratory:  Negative for shortness of breath.   Genitourinary:  Negative for dysuria, genital sores and penile discharge.  Neurological:  Negative for numbness.     Physical Exam Triage Vital Signs ED Triage Vitals  Enc Vitals Group     BP       Pulse      Resp      Temp      Temp src      SpO2      Weight      Height      Head Circumference      Peak Flow      Pain Score      Pain Loc      Pain Edu?      Excl. in [place]?    No data found.  Updated Vital Signs BP 113/77 (BP Location: Left Arm)   Pulse 67   Temp 98.1 F (36.7 C) (Oral)   Resp 16   SpO2 97%   Physical Exam Vitals and nursing note reviewed.  Constitutional:      General: He is not in acute distress.    Appearance: Normal appearance. He is not ill-appearing.  HENT:     Head: Normocephalic and atraumatic.  Eyes:     Conjunctiva/sclera: Conjunctivae normal.  Cardiovascular:     Rate and Rhythm: Normal rate.  Pulmonary:     Effort: Pulmonary effort is normal. No respiratory distress.  Neurological:     Mental Status: He is alert.  Psychiatric:        Mood and Affect: Mood normal.  Behavior: Behavior normal.        Thought Content: Thought content normal.      UC Treatments / Results  Labs (all labs ordered are listed, but only abnormal results are displayed) Labs Reviewed  HIV ANTIBODY (ROUTINE TESTING W REFLEX)  RPR  HEPATITIS PANEL, ACUTE  CERVICOVAGINAL ANCILLARY ONLY    EKG   Radiology No results found.  Procedures Procedures (including critical care time)  Medications Ordered in UC Medications - No data to display  Initial Impression / Assessment and Plan / UC Course  I have reviewed the triage vital signs and the nursing notes.  Pertinent labs & imaging results that were available during my care of the patient were reviewed by me and considered in my medical decision making (see chart for details).    STD screening ordered. Patient requests STD blood work as well. Encouraged follow up with any concerns, otherwise will await results for further recommendation.   Final Clinical Impressions(s) / UC Diagnoses   Final diagnoses:  Screening for STD (sexually transmitted disease)   Discharge Instructions    None    ED Prescriptions   None    PDMP not reviewed this encounter.   Francene Finders, PA-C 04/04/22 [phone]

## 2022-04-05 LAB — HIV ANTIBODY (ROUTINE TESTING W REFLEX): HIV Screen 4th Generation wRfx: NONREACTIVE

## 2022-04-05 LAB — RPR: RPR Ser Ql: NONREACTIVE

## 2022-04-07 ENCOUNTER — Telehealth: Payer: Self-pay

## 2022-04-07 ENCOUNTER — Other Ambulatory Visit (HOSPITAL_COMMUNITY)
Admission: RE | Admit: 2022-04-07 | Discharge: 2022-04-07 | Disposition: A | Discharge status: Home / Self Care | Payer: 59 | Source: Ambulatory Visit | Attending: Internal Medicine | Admitting: Internal Medicine

## 2022-04-07 DIAGNOSIS — Z113 Encounter for screening for infections with a predominantly sexual mode of transmission: Secondary | ICD-10-CM | POA: Insufficient documentation

## 2022-04-07 LAB — CERVICOVAGINAL ANCILLARY ONLY
Bacterial Vaginitis (gardnerella): POSITIVE — AB
Candida Glabrata: NEGATIVE
Candida Vaginitis: NEGATIVE
Chlamydia: NEGATIVE
Comment: NEGATIVE
Comment: NEGATIVE
Comment: NEGATIVE
Comment: NEGATIVE
Comment: NEGATIVE
Comment: NORMAL
Neisseria Gonorrhea: NEGATIVE
Trichomonas: NEGATIVE

## 2022-04-07 NOTE — Telephone Encounter (Signed)
Pt to clinic for cyto reswab. Recollected without complication. Education provided.

## 2022-04-08 LAB — CYTOLOGY, (ORAL, ANAL, URETHRAL) ANCILLARY ONLY
Chlamydia: NEGATIVE
Comment: NEGATIVE
Comment: NEGATIVE
Comment: NORMAL
Neisseria Gonorrhea: NEGATIVE
Trichomonas: POSITIVE — AB

## 2022-04-09 ENCOUNTER — Ambulatory Visit
Admission: EM | Admit: 2022-04-09 | Discharge: 2022-04-09 | Discharge status: Against Medical Advice | Payer: 59 | Attending: Physician Assistant | Admitting: Physician Assistant

## 2022-04-09 DIAGNOSIS — A599 Trichomoniasis, unspecified: Secondary | ICD-10-CM

## 2022-04-09 MED ORDER — METRONIDAZOLE 500 MG PO TABS: 500.0000 mg | ORAL_TABLET | Freq: Two times a day (BID) | ORAL | 0 refills | Status: DC

## 2022-04-09 NOTE — ED Provider Notes (Signed)
Patient here for results from cytology swab- positive for trich- will send in metronidazole.    Francene Finders, PA-C 04/09/22 [phone]

## 2022-05-16 ENCOUNTER — Ambulatory Visit
Admission: EM | Admit: 2022-05-16 | Discharge: 2022-05-16 | Disposition: A | Discharge status: Home / Self Care | Payer: 59 | Attending: Family Medicine | Admitting: Family Medicine

## 2022-05-16 ENCOUNTER — Encounter: Payer: Self-pay | Admitting: Emergency Medicine

## 2022-05-16 DIAGNOSIS — Z202 Contact with and (suspected) exposure to infections with a predominantly sexual mode of transmission: Secondary | ICD-10-CM | POA: Insufficient documentation

## 2022-05-16 NOTE — Discharge Instructions (Signed)
Staff will notify you of any positive tests on the swab

## 2022-05-16 NOTE — ED Triage Notes (Signed)
Was evaluated a few weeks prior, tested positive for trich, took medication, is requesting to be retested to verify it's gone. Denies any current symptoms.

## 2022-05-16 NOTE — ED Provider Notes (Signed)
[name]    [name]: [id number] Arrival date & time: 05/16/22  0815      History   Chief Complaint Chief Complaint  Patient presents with   SEXUALLY TRANSMITTED DISEASE    HPI Derek Warren is a 27 y.o. male.   HPI Here for screening for sexually transmitted diseases.  On February 5 he tested positive for trichomonas.  He was treated with metronidazole 500 mg twice daily for 7 days.  He is currently not having any symptoms.  No dysuria or discharge and no vomiting or fever or abdominal pain  He is HIV and RPR were negative in February.  Past Medical History:  Diagnosis Date   STD (sexually transmitted disease)     There are no problems to display for this patient.   History reviewed. No pertinent surgical history.     Home Medications    Prior to Admission medications   Not on File    Family History Family History  Problem Relation Age of Onset   Cancer Other     Social History Social History   Tobacco Use   Smoking status: Never   Smokeless tobacco: Never  Vaping Use   Vaping Use: Never used  Substance Use Topics   Alcohol use: Yes    Alcohol/week: 0.0 standard drinks of alcohol    Comment: occ   Drug use: Never     Allergies   Patient has no known allergies.   Review of Systems Review of Systems   Physical Exam Triage Vital Signs ED Triage Vitals [05/16/22 0903]  Enc Vitals Group     BP 127/73     Pulse Rate 75     Resp 16     Temp 99 F (37.2 C)     Temp Source Oral     SpO2 96 %     Weight      Height      Head Circumference      Peak Flow      Pain Score 0     Pain Loc      Pain Edu?      Excl. in [place]?    No data found.  Updated Vital Signs BP 127/73 (BP Location: Left Arm)   Pulse 75   Temp 99 F (37.2 C) (Oral)   Resp 16   SpO2 96%   Visual Acuity Right Eye Distance:   Left Eye Distance:   Bilateral Distance:    Right Eye Near:   Left Eye Near:    Bilateral Near:     Physical  Exam Vitals reviewed.  Constitutional:      General: He is not in acute distress.    Appearance: He is not ill-appearing, toxic-appearing or diaphoretic.  Skin:    Coloration: Skin is not pale.  Neurological:     Mental Status: He is alert and oriented to person, place, and time.  Psychiatric:        Behavior: Behavior normal.      UC Treatments / Results  Labs (all labs ordered are listed, but only abnormal results are displayed) Labs Reviewed  CYTOLOGY, (ORAL, ANAL, URETHRAL) ANCILLARY ONLY    EKG   Radiology No results found.  Procedures Procedures (including critical care time)  Medications Ordered in UC Medications - No data to display  Initial Impression / Assessment and Plan / UC Course  I have reviewed the triage vital signs and the nursing notes.  Pertinent labs & imaging results that  were available during my care of the patient were reviewed by me and considered in my medical decision making (see chart for details).        Self swab is done of the urethra, and we will let him know of any positives on the swab and treat per protocol. Final Clinical Impressions(s) / UC Diagnoses   Final diagnoses:  Exposure to STD     Discharge Instructions      Staff will notify you of any positive tests on the swab     ED Prescriptions   None    PDMP not reviewed this encounter.   Barrett Henle, MD 05/16/22 [phone]

## 2022-05-19 LAB — CYTOLOGY, (ORAL, ANAL, URETHRAL) ANCILLARY ONLY
Chlamydia: NEGATIVE
Comment: NEGATIVE
Comment: NEGATIVE
Comment: NORMAL
Neisseria Gonorrhea: NEGATIVE
Trichomonas: NEGATIVE

## 2022-07-11 ENCOUNTER — Ambulatory Visit
Admission: EM | Admit: 2022-07-11 | Discharge: 2022-07-11 | Disposition: A | Discharge status: Home / Self Care | Payer: 59 | Attending: Internal Medicine | Admitting: Internal Medicine

## 2022-07-11 DIAGNOSIS — K5900 Constipation, unspecified: Secondary | ICD-10-CM

## 2022-07-11 DIAGNOSIS — E639 Nutritional deficiency, unspecified: Secondary | ICD-10-CM | POA: Diagnosis not present

## 2022-07-11 MED ORDER — DOCUSATE SODIUM 100 MG PO CAPS: 100.0000 mg | ORAL_CAPSULE | Freq: Every day | ORAL | 0 refills | Status: AC

## 2022-07-11 MED ORDER — POLYETHYLENE GLYCOL 3350 17 GM/SCOOP PO POWD: 1.0000 | Freq: Once | ORAL | 0 refills | Status: AC

## 2022-07-11 NOTE — Discharge Instructions (Addendum)
Your abdominal pain/physical exam findings are consistent with constipation. Start taking MiraLAX once daily until you are able to have a soft normal bowel movement.  Once you are able to have a soft normal bowel movement, use once daily for the next 3 days then use as needed.  Increase the amount of fiber you are eating by eating more fruits, vegetables, and whole grains.  Increase your water intake to at least 8 cups of water per day to maintain good hydration.  Take Colace stool softener daily to prevent constipation in the future. You may purchase colace over the counter and take this once daily to help keep stools soft.   If you have not had a bowel movement in the next 2 to 3 days, please return to urgent care.  If you develop any new or worsening symptoms that are severe, please go to the emergency room for further evaluation.  I hope you feel better!

## 2022-07-11 NOTE — ED Provider Notes (Signed)
[name]    [name]: [id number] Arrival date & time: 07/11/22  0804      History   Chief Complaint Chief Complaint  Patient presents with   Constipation    HPI Derek Warren is a 27 y.o. male.   Patient presents to urgent care for evaluation of constipation that has been intermittent for "a while now" but worsened over the last 1 week.  States stools are hard/small and he has having to strain to have a bowel movement.  Last bowel movement was yesterday but was hard and small.  States he has had an anal fissure in the past and has seen blood on the toilet paper when he wipes after using the restroom in the past but not recently.  Reports he eats lots of fast food but has been attempting to eat more fruits and vegetables and has been eating oranges lately to increase fiber intake.  Drinks mostly Gatorade, does not drink much water (1 bottle per day).  Denies nausea, vomiting, dizziness, abdominal pain, fever/chills, urinary symptoms, back pain, body aches, and history of abdominal surgeries.  He has not attempted use of any over-the-counter medications to help with symptoms before coming to urgent care.   Constipation   Past Medical History:  Diagnosis Date   STD (sexually transmitted disease)     There are no problems to display for this patient.   History reviewed. No pertinent surgical history.     Home Medications    Prior to Admission medications   Medication Sig Start Date End Date Taking? Authorizing Provider  docusate sodium (COLACE) 100 MG capsule Take 1 capsule (100 mg total) by mouth daily. 07/11/22  Yes Carlisle Beers, FNP  polyethylene glycol powder (GLYCOLAX/MIRALAX) 17 GM/SCOOP powder Take 255 g by mouth once for 1 dose. 07/11/22 07/11/22 Yes Zakariah Dejarnette, Donavan Burnet, FNP    Family History Family History  Problem Relation Age of Onset   Cancer Other     Social History Social History   Tobacco Use   Smoking status: Never   Smokeless  tobacco: Never  Vaping Use   Vaping Use: Never used  Substance Use Topics   Alcohol use: Yes    Alcohol/week: 0.0 standard drinks of alcohol    Comment: occ   Drug use: Never     Allergies   Patient has no known allergies.   Review of Systems Review of Systems  Gastrointestinal:  Positive for constipation.  Per HPI   Physical Exam Triage Vital Signs ED Triage Vitals  Enc Vitals Group     BP 07/11/22 0822 122/69     Pulse Rate 07/11/22 0822 71     Resp 07/11/22 0822 16     Temp 07/11/22 0822 98 F (36.7 C)     Temp Source 07/11/22 0822 Oral     SpO2 07/11/22 0822 98 %     Weight --      Height --      Head Circumference --      Peak Flow --      Pain Score 07/11/22 0824 0     Pain Loc --      Pain Edu? --      Excl. in GC? --    No data found.  Updated Vital Signs BP 122/69 (BP Location: Left Arm)   Pulse 71   Temp 98 F (36.7 C) (Oral)   Resp 16   SpO2 98%   Visual Acuity Right Eye Distance:  Left Eye Distance:   Bilateral Distance:    Right Eye Near:   Left Eye Near:    Bilateral Near:     Physical Exam Vitals and nursing note reviewed.  Constitutional:      Appearance: He is not ill-appearing or toxic-appearing.  HENT:     Head: Normocephalic and atraumatic.     Right Ear: Hearing and external ear normal.     Left Ear: Hearing and external ear normal.     Nose: Nose normal.     Mouth/Throat:     Lips: Pink.  Eyes:     General: Lids are normal. Vision grossly intact. Gaze aligned appropriately.     Extraocular Movements: Extraocular movements intact.     Conjunctiva/sclera: Conjunctivae normal.  Pulmonary:     Effort: Pulmonary effort is normal.  Abdominal:     General: Bowel sounds are normal.     Palpations: Abdomen is soft.     Tenderness: There is no abdominal tenderness. There is no right CVA tenderness, left CVA tenderness or guarding.  Musculoskeletal:     Cervical back: Neck supple.  Skin:    General: Skin is warm and dry.      Capillary Refill: Capillary refill takes less than 2 seconds.     Findings: No rash.  Neurological:     General: No focal deficit present.     Mental Status: He is alert and oriented to person, place, and time. Mental status is at baseline.     Cranial Nerves: No dysarthria or facial asymmetry.  Psychiatric:        Mood and Affect: Mood normal.        Speech: Speech normal.        Behavior: Behavior normal.        Thought Content: Thought content normal.        Judgment: Judgment normal.      UC Treatments / Results  Labs (all labs ordered are listed, but only abnormal results are displayed) Labs Reviewed - No data to display  EKG   Radiology No results found.  Procedures Procedures (including critical care time)  Medications Ordered in UC Medications - No data to display  Initial Impression / Assessment and Plan / UC Course  I have reviewed the triage vital signs and the nursing notes.  Pertinent labs & imaging results that were available during my care of the patient were reviewed by me and considered in my medical decision making (see chart for details).   1.  Constipation, poor diet  Patient to begin taking MiraLAX 1-2 times daily until able to have a soft bowel movement.  Then, MiraLAX to be used once daily for 3 days, then as needed.  Advised to increase water intake, fiber intake, and exercise.  Colace stool softener once daily prescribed to prevent future constipation.  Patient to return to urgent care if she is unable to have a soft bowel movement in 24 to 48 hours.   Discussed physical exam and available lab work findings in clinic with patient.  Counseled patient regarding appropriate use of medications and potential side effects for all medications recommended or prescribed today. Discussed red flag signs and symptoms of worsening condition,when to call the PCP office, return to urgent care, and when to seek higher level of care in the emergency department.  Patient verbalizes understanding and agreement with plan. All questions answered. Patient discharged in stable condition.    Final Clinical Impressions(s) / UC Diagnoses  Final diagnoses:  Constipation, unspecified constipation type  Poor diet     Discharge Instructions      Your abdominal pain/physical exam findings are consistent with constipation. Start taking MiraLAX once daily until you are able to have a soft normal bowel movement.  Once you are able to have a soft normal bowel movement, use once daily for the next 3 days then use as needed.  Increase the amount of fiber you are eating by eating more fruits, vegetables, and whole grains.  Increase your water intake to at least 8 cups of water per day to maintain good hydration.  Take Colace stool softener daily to prevent constipation in the future. You may purchase colace over the counter and take this once daily to help keep stools soft.   If you have not had a bowel movement in the next 2 to 3 days, please return to urgent care.  If you develop any new or worsening symptoms that are severe, please go to the emergency room for further evaluation.  I hope you feel better!     ED Prescriptions     Medication Sig Dispense Auth. Provider   polyethylene glycol powder (GLYCOLAX/MIRALAX) 17 GM/SCOOP powder Take 255 g by mouth once for 1 dose. 255 g Reita May M, FNP   docusate sodium (COLACE) 100 MG capsule Take 1 capsule (100 mg total) by mouth daily. 60 capsule Carlisle Beers, FNP      PDMP not reviewed this encounter.   Reita May [place], [place] 07/11/22 [phone]

## 2022-07-11 NOTE — ED Triage Notes (Signed)
Pt reports constipation x 1 week. Yesterday was last bowel movement. Pt ha snot taken any meds for complaint.

## 2022-07-30 ENCOUNTER — Encounter (HOSPITAL_COMMUNITY): Payer: Self-pay | Admitting: Emergency Medicine

## 2022-07-30 ENCOUNTER — Ambulatory Visit (HOSPITAL_COMMUNITY)
Admission: EM | Admit: 2022-07-30 | Discharge: 2022-07-30 | Disposition: A | Discharge status: Home / Self Care | Payer: 59 | Attending: Internal Medicine | Admitting: Internal Medicine

## 2022-07-30 DIAGNOSIS — K649 Unspecified hemorrhoids: Secondary | ICD-10-CM

## 2022-07-30 DIAGNOSIS — K625 Hemorrhage of anus and rectum: Secondary | ICD-10-CM

## 2022-07-30 MED ORDER — HYDROCORTISONE 1 % EX CREA: TOPICAL_CREAM | CUTANEOUS | 0 refills | Status: AC

## 2022-07-30 NOTE — ED Provider Notes (Signed)
[facility]    [name]: [id number] Arrival date & time: 07/30/22  0802      History   Chief Complaint Chief Complaint  Patient presents with   Constipation    HPI Derek Warren is a 27 y.o. male.   Patient presents to urgent care for evaluation of rectal bleeding and constipation that has been ongoing over the last 3 to 4 weeks.  Rectal bleeding is intermittent and bright red in color.  He states he only sees bright red blood with wiping after using the restroom to have a bowel movement and does not see any blood streaks in the stool.  No mucus to the stools.  Rectal bleeding does not happen every time he has a bowel movement.  Patient has had intermittent constipation over the last 3 to 4 weeks and was seen on Jul 11, 2022 for constipation.  He has been taking Colace stool softener daily and has decreased the amount of fast food in his diet/increased his fiber intake with some improvement in constipation.  He has also been using MiraLAX intermittently as needed for hard stools.  States this has helped significantly with constipation and his stools are now soft, however he continues to find himself needing to strain when using the restroom to have a bowel movement sometimes.  Denies history of colorectal cancer, fevers, chills, abdominal pain, nausea, vomiting, dizziness, urinary symptoms, low back pain, and bodyaches.   Constipation   Past Medical History:  Diagnosis Date   STD (sexually transmitted disease)     There are no problems to display for this patient.   History reviewed. No pertinent surgical history.     Home Medications    Prior to Admission medications   Medication Sig Start Date End Date Taking? Authorizing Provider  hydrocortisone cream 1 % Apply to affected area 2 times daily 07/30/22  Yes Kahlen Morais, Donavan Burnet, FNP  docusate sodium (COLACE) 100 MG capsule Take 1 capsule (100 mg total) by mouth daily. 07/11/22   Carlisle Beers, FNP     Family History Family History  Problem Relation Age of Onset   Cancer Other     Social History Social History   Tobacco Use   Smoking status: Never   Smokeless tobacco: Never  Vaping Use   Vaping Use: Never used  Substance Use Topics   Alcohol use: Yes    Alcohol/week: 0.0 standard drinks of alcohol    Comment: occ   Drug use: Never     Allergies   Patient has no known allergies.   Review of Systems Review of Systems  Gastrointestinal:  Positive for constipation.  Per HPI   Physical Exam Triage Vital Signs ED Triage Vitals [07/30/22 0818]  Enc Vitals Group     BP 117/75     Pulse Rate 60     Resp 14     Temp 98 F (36.7 C)     Temp Source Oral     SpO2 97 %     Weight      Height      Head Circumference      Peak Flow      Pain Score 0     Pain Loc      Pain Edu?      Excl. in GC?    No data found.  Updated Vital Signs BP 117/75 (BP Location: Right Arm)   Pulse 60   Temp 98 F (36.7 C) (Oral)   Resp 14  SpO2 97%   Visual Acuity Right Eye Distance:   Left Eye Distance:   Bilateral Distance:    Right Eye Near:   Left Eye Near:    Bilateral Near:     Physical Exam Vitals and nursing note reviewed.  Constitutional:      Appearance: He is not ill-appearing or toxic-appearing.  HENT:     Head: Normocephalic and atraumatic.     Right Ear: Hearing and external ear normal.     Left Ear: Hearing and external ear normal.     Nose: Nose normal.     Mouth/Throat:     Lips: Pink.  Eyes:     General: Lids are normal. Vision grossly intact. Gaze aligned appropriately.     Extraocular Movements: Extraocular movements intact.     Conjunctiva/sclera: Conjunctivae normal.  Pulmonary:     Effort: Pulmonary effort is normal.  Genitourinary:    Comments: Dee, CMA chaperone. 1 non-thrombosed hemorrhoid present to the 3 o clock position of the rectum. No anal fissures, drainage, or blood noted on exam.  Musculoskeletal:     Cervical back: Neck  supple.  Skin:    General: Skin is warm and dry.     Capillary Refill: Capillary refill takes less than 2 seconds.     Findings: No rash.  Neurological:     General: No focal deficit present.     Mental Status: He is alert and oriented to person, place, and time. Mental status is at baseline.     Cranial Nerves: No dysarthria or facial asymmetry.  Psychiatric:        Mood and Affect: Mood normal.        Speech: Speech normal.        Behavior: Behavior normal.        Thought Content: Thought content normal.        Judgment: Judgment normal.      UC Treatments / Results  Labs (all labs ordered are listed, but only abnormal results are displayed) Labs Reviewed - No data to display  EKG   Radiology No results found.  Procedures Procedures (including critical care time)  Medications Ordered in UC Medications - No data to display  Initial Impression / Assessment and Plan / UC Course  I have reviewed the triage vital signs and the nursing notes.  Pertinent labs & imaging results that were available during my care of the patient were reviewed by me and considered in my medical decision making (see chart for details).   1.  Bright red blood per rectum, hemorrhoids Presentation is consistent with BRBPR due to hemorrhoid.  Patient to continue taking Colace stool softener daily to promote soft bowel movements and use MiraLAX as needed for constipation relief.  Preparation H hemorrhoid cream twice daily for 5 to 7 days sent to pharmacy to be taken as prescribed.  Discussed further lifestyle changes and increased water intake to promote healthy bowel movements.  Nontender to the abdomen.  Low suspicion for gastrointestinal bleeding, malignancy, etc.  PCP follow-up recommended as needed.  Discussed physical exam and available lab work findings in clinic with patient.  Counseled patient regarding appropriate use of medications and potential side effects for all medications recommended or  prescribed today. Discussed red flag signs and symptoms of worsening condition,when to call the PCP office, return to urgent care, and when to seek higher level of care in the emergency department. Patient verbalizes understanding and agreement with plan. All questions answered. Patient discharged in  stable condition.    Final Clinical Impressions(s) / UC Diagnoses   Final diagnoses:  Bright red blood per rectum  Hemorrhoids, unspecified hemorrhoid type     Discharge Instructions      Your rectal bleeding is likely due to hemorrhoid found on exam.  Use Preparation H hemorrhoid cream twice daily for the next 5 to 7 days after you have a bowel movement this will help to reduce inflammation to the rectal area as well as pain and bleeding..  Do not use this longer than 7 days.   Continue use of stool softener to keep stools nice and soft as this will also help with hemorrhoid.  Continue using MiraLAX as needed for constipation.  Continue dietary and lifestyle changes to reduce fast food intake and increase exercise/fiber.   If you develop any new or worsening symptoms or do not improve in the next 2 to 3 days, please return.  If your symptoms are severe, please go to the emergency room.  Follow-up with your primary care provider for further evaluation and management of your symptoms as well as ongoing wellness visits.  I hope you feel better!     ED Prescriptions     Medication Sig Dispense Auth. Provider   hydrocortisone cream 1 % Apply to affected area 2 times daily 15 g Carlisle Beers, FNP      PDMP not reviewed this encounter.   Reita May [place], [place] 07/30/22 [phone]

## 2022-07-30 NOTE — Discharge Instructions (Addendum)
Your rectal bleeding is likely due to hemorrhoid found on exam.  Use Preparation H hemorrhoid cream twice daily for the next 5 to 7 days after you have a bowel movement this will help to reduce inflammation to the rectal area as well as pain and bleeding..  Do not use this longer than 7 days.   Continue use of stool softener to keep stools nice and soft as this will also help with hemorrhoid.  Continue using MiraLAX as needed for constipation.  Continue dietary and lifestyle changes to reduce fast food intake and increase exercise/fiber.   If you develop any new or worsening symptoms or do not improve in the next 2 to 3 days, please return.  If your symptoms are severe, please go to the emergency room.  Follow-up with your primary care provider for further evaluation and management of your symptoms as well as ongoing wellness visits.  I hope you feel better!

## 2022-07-30 NOTE — ED Triage Notes (Addendum)
Pt reports issues with constipation. Reports pain and straining with having a stool. Noticed blood yesterday when had BM. Reports taking the stool softener and Miralax was prescribed.

## 2022-08-08 ENCOUNTER — Encounter (HOSPITAL_COMMUNITY): Payer: Self-pay

## 2022-08-08 ENCOUNTER — Ambulatory Visit (HOSPITAL_COMMUNITY)
Admission: EM | Admit: 2022-08-08 | Discharge: 2022-08-08 | Disposition: A | Discharge status: Home / Self Care | Payer: 59 | Attending: Emergency Medicine | Admitting: Emergency Medicine

## 2022-08-08 DIAGNOSIS — K648 Other hemorrhoids: Secondary | ICD-10-CM | POA: Diagnosis not present

## 2022-08-08 DIAGNOSIS — Z113 Encounter for screening for infections with a predominantly sexual mode of transmission: Secondary | ICD-10-CM | POA: Diagnosis not present

## 2022-08-08 LAB — HIV ANTIBODY (ROUTINE TESTING W REFLEX): HIV Screen 4th Generation wRfx: NONREACTIVE

## 2022-08-08 MED ORDER — WITCH HAZEL-GLYCERIN EX PADS: MEDICATED_PAD | CUTANEOUS | 12 refills | Status: DC

## 2022-08-08 MED ORDER — PHENYLEPHRINE IN HARD FAT 0.25 % RE SUPP: 1.0000 | Freq: Two times a day (BID) | RECTAL | 0 refills | Status: AC

## 2022-08-08 NOTE — ED Triage Notes (Addendum)
Hemorrhoids onset 1 month ago. Used a cream with no relief. Blood when wiping.   Patient also requesting routine STD testing. No symptoms or known exposure.

## 2022-08-08 NOTE — ED Provider Notes (Signed)
[facility]    [name]: [id number] Arrival date & time: 08/08/22  0931      History   Chief Complaint Chief Complaint  Patient presents with   Hemorrhoids   SEXUALLY TRANSMITTED DISEASE    Testing     HPI Derek Warren is a 27 y.o. male.   Patient presents to clinic for sexually-transmitted infection screening and continued hemorrhoid.  Reports overall he does not see a size difference with his hemorrhoid, has been using the hydrocortisone cream topically for the past 7 days, and was returning to clinic because he was instructed not to take longer for 7 days due to mucosal thinning.  He does have some bright red blood per rectum when he wipes.  Reports he is no longer constipated and the stool softener and MiraLAX have worked well.  Denies any straining with bowel movements.  He is also requesting sexually-transmitted infection screening.  He is asymptomatic with no known exposures.  Would like HIV and syphilis screening.     The history is provided by the patient and medical records.    Past Medical History:  Diagnosis Date   STD (sexually transmitted disease)     There are no problems to display for this patient.   History reviewed. No pertinent surgical history.     Home Medications    Prior to Admission medications   Medication Sig Start Date End Date Taking? Authorizing Provider  docusate sodium (COLACE) 100 MG capsule Take 1 capsule (100 mg total) by mouth daily. 07/11/22  Yes StanhopeDonavan Burnet, FNP  GAVILAX 17 GM/SCOOP powder Take by mouth. 07/11/22  Yes [provider]  hydrocortisone cream 1 % Apply to affected area 2 times daily 07/30/22  Yes [place], Donavan Burnet, FNP  phenylephrine (,USE FOR PREPARATION-H,) 0.25 % suppository Place 1 suppository rectally 2 (two) times daily for 7 days. 08/08/22 08/15/22 Yes Rinaldo Ratel, [place], FNP  witch hazel-glycerin (TUCKS) pad Apply as needed up to 6 times per day or after each bowel movement.  Use  as needed for relief of irritation and itching. 08/08/22  Yes Milagros Middendorf, [place], FNP    Family History Family History  Problem Relation Age of Onset   Cancer Other     Social History Social History   Tobacco Use   Smoking status: Never   Smokeless tobacco: Never  Vaping Use   Vaping Use: Never used  Substance Use Topics   Alcohol use: Yes    Alcohol/week: 0.0 standard drinks of alcohol    Comment: occ   Drug use: Never     Allergies   Patient has no known allergies.   Review of Systems Review of Systems  Constitutional:  Negative for fever.  Gastrointestinal:  Positive for anal bleeding and rectal pain.  Genitourinary:  Negative for dysuria.     Physical Exam Triage Vital Signs ED Triage Vitals  Enc Vitals Group     BP 08/08/22 1030 110/74     Pulse Rate 08/08/22 1030 (!) 58     Resp 08/08/22 1030 18     Temp 08/08/22 1030 98.3 F (36.8 C)     Temp Source 08/08/22 1030 Oral     SpO2 08/08/22 1030 96 %     Weight --      Height 08/08/22 1029 5\' 10"  (1.778 m)     Head Circumference --      Peak Flow --      Pain Score 08/08/22 1028 0  Pain Loc --      Pain Edu? --      Excl. in GC? --    No data found.  Updated Vital Signs BP 110/74 (BP Location: Left Arm)   Pulse (!) 58   Temp 98.3 F (36.8 C) (Oral)   Resp 18   Ht 5\' 10"  (1.778 m)   SpO2 96%   BMI 24.38 kg/m   Visual Acuity Right Eye Distance:   Left Eye Distance:   Bilateral Distance:    Right Eye Near:   Left Eye Near:    Bilateral Near:     Physical Exam Vitals and nursing note reviewed. Exam conducted with a chaperone present.  Constitutional:      Appearance: Normal appearance.  HENT:     Head: Normocephalic and atraumatic.     Right Ear: External ear normal.     Left Ear: External ear normal.     Nose: Nose normal.     Mouth/Throat:     Mouth: Mucous membranes are moist.  Eyes:     Conjunctiva/sclera: Conjunctivae normal.  Cardiovascular:     Rate and Rhythm: Normal  rate.  Pulmonary:     Effort: Pulmonary effort is normal. No respiratory distress.  Genitourinary:    Rectum: Tenderness, external hemorrhoid and internal hemorrhoid present. No mass or anal fissure.     Comments: No obvious masses, purulent drainage, anal fissures or infection.  Small external hemorrhoid that is not thrombosed.  On internal examination there is a large internal hemorrhoid. Skin:    General: Skin is warm and dry.  Neurological:     General: No focal deficit present.     Mental Status: He is alert and oriented to person, place, and time.  Psychiatric:        Mood and Affect: Mood normal.        Behavior: Behavior normal. Behavior is cooperative.      UC Treatments / Results  Labs (all labs ordered are listed, but only abnormal results are displayed) Labs Reviewed  RPR  HIV ANTIBODY (ROUTINE TESTING W REFLEX)  CYTOLOGY, (ORAL, ANAL, URETHRAL) ANCILLARY ONLY    EKG   Radiology No results found.  Procedures Procedures (including critical care time)  Medications Ordered in UC Medications - No data to display  Initial Impression / Assessment and Plan / UC Course  I have reviewed the triage vital signs and the nursing notes.  Pertinent labs & imaging results that were available during my care of the patient were reviewed by me and considered in my medical decision making (see chart for details).  Vitals and triage reviewed, patient is hemodynamically stable.  Will obtain STI screening and contact if treatment is indicated.  On external exam there is a nonthrombosed external hemorrhoid, internal exam does show large, tender internal hemorrhoid.  Advised suppository and witch hazel pads.  Continue with dietary modifications and stool softener.  Given information for the surgical center if symptoms persist.  Plan of care, follow-up care and return precautions given, no questions at this time.     Final Clinical Impressions(s) / UC Diagnoses   Final diagnoses:   Internal hemorrhoid  Screening examination for sexually transmitted disease     Discharge Instructions      You appear to have a internal hemorrhoid on your physical exam.  Please use the suppository twice daily as needed for the next 7 days.  You can also try the witch hazel pads as needed 6 times daily or  after each bowel movement.  Please ensure you are taking the stool softener, drinking plenty of water and not straining.  We will contact you if anything results is abnormal with your sexually-transmitted infection screening.  If your hemorrhoid symptoms persist, you can consider following up with the surgical center for surgical intervention of your hemorrhoid.  Please return to clinic for any new or concerning symptoms.       ED Prescriptions     Medication Sig Dispense Auth. Provider   phenylephrine (,USE FOR PREPARATION-H,) 0.25 % suppository Place 1 suppository rectally 2 (two) times daily for 7 days. 14 suppository Rinaldo Ratel, [place], [place]   witch hazel-glycerin (TUCKS) pad Apply as needed up to 6 times per day or after each bowel movement.  Use as needed for relief of irritation and itching. 40 each Anastassia Noack, [place] N, FNP      PDMP not reviewed this encounter.   Aleksander Edmiston, [place], [place] 08/08/22 1059

## 2022-08-08 NOTE — Discharge Instructions (Addendum)
You appear to have a internal hemorrhoid on your physical exam.  Please use the suppository twice daily as needed for the next 7 days.  You can also try the witch hazel pads as needed 6 times daily or after each bowel movement.  Please ensure you are taking the stool softener, drinking plenty of water and not straining.  We will contact you if anything results is abnormal with your sexually-transmitted infection screening.  If your hemorrhoid symptoms persist, you can consider following up with the surgical center for surgical intervention of your hemorrhoid.  Please return to clinic for any new or concerning symptoms.

## 2022-08-09 LAB — RPR: RPR Ser Ql: NONREACTIVE

## 2022-08-11 LAB — CYTOLOGY, (ORAL, ANAL, URETHRAL) ANCILLARY ONLY
Chlamydia: NEGATIVE
Comment: NEGATIVE
Comment: NEGATIVE
Comment: NORMAL
Neisseria Gonorrhea: NEGATIVE
Trichomonas: POSITIVE — AB

## 2022-08-12 ENCOUNTER — Telehealth (HOSPITAL_COMMUNITY): Payer: Self-pay | Admitting: Emergency Medicine

## 2022-08-12 MED ORDER — METRONIDAZOLE 500 MG PO TABS: 2000.0000 mg | ORAL_TABLET | Freq: Once | ORAL | 0 refills | Status: AC

## 2022-09-08 IMAGING — CT CT NECK W/ CM
4 series · 14 of 33 positions shown, 17 images · IV contrast (agent unspecified)
Comparison: None.

CLINICAL DATA: Soft tissue swelling, infection suspected

EXAM:
CT NECK WITH CONTRAST
TECHNIQUE: Multidetector CT imaging of the neck was performed using the
standard protocol following the bolus administration of intravenous
contrast.

[Series 2: axial neck · axial · 0.53mm/px · z∈[+1166,+1342]mm · 5 of 133 slices shown, 7 images]
[im 23/133  soft-tissue]
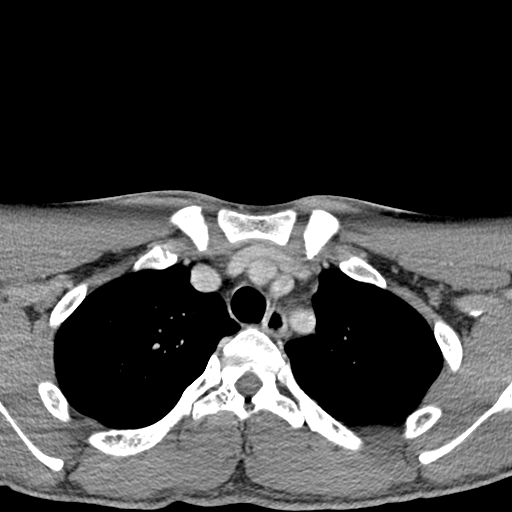
[im 23/133  bone]
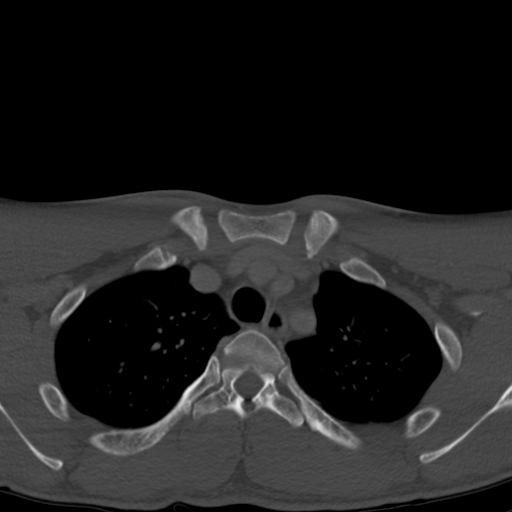
[im 45/133  bone]
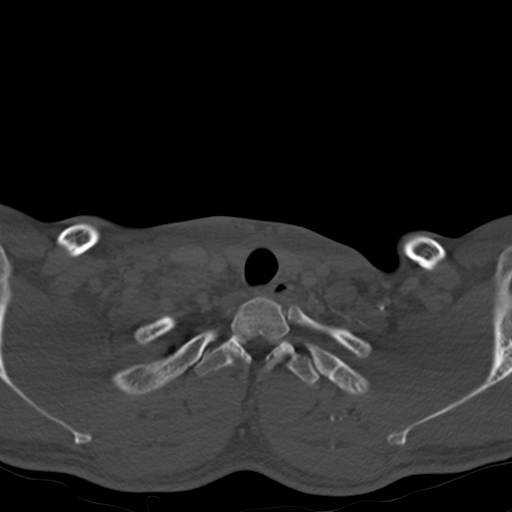
[im 67/133  bone]
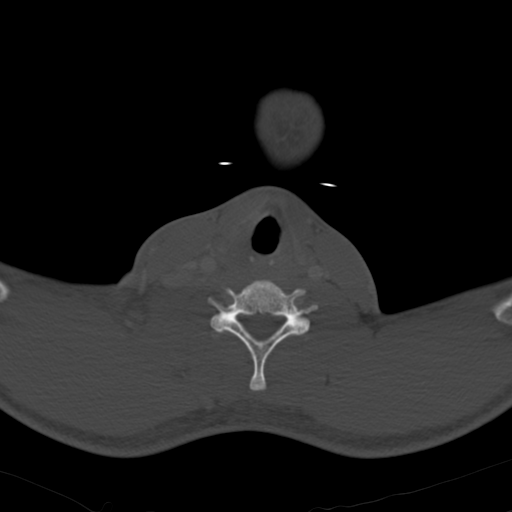
[im 89/133  bone]
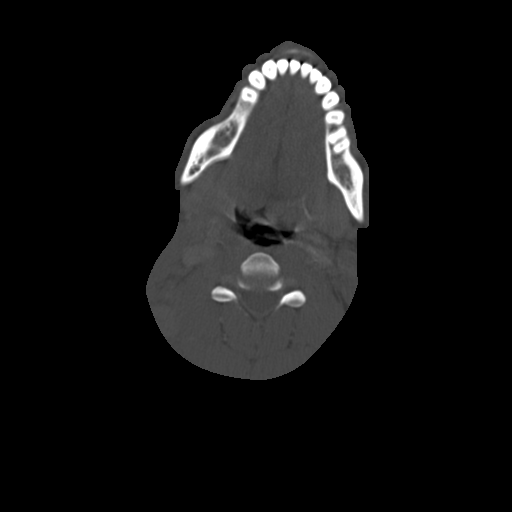
[im 111/133  soft-tissue]
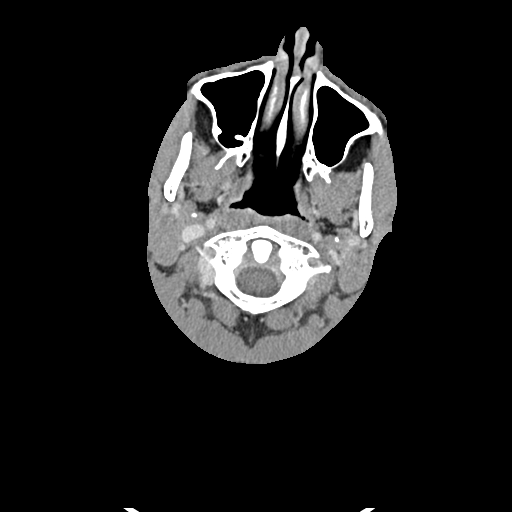
[im 111/133  bone]
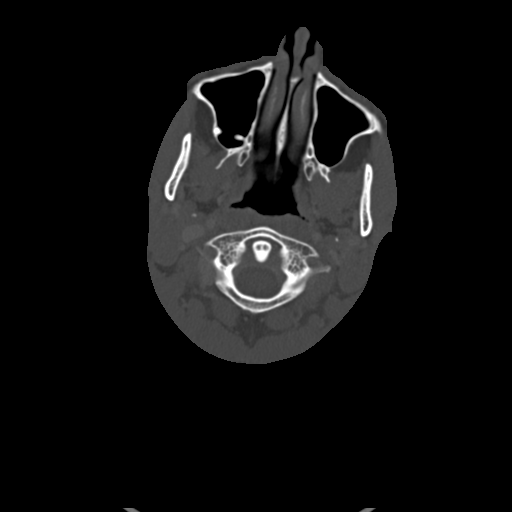

[Series 5: axial · axial · 0.39mm/px · 1 of 133 slices shown]
[im 23/133  bone]
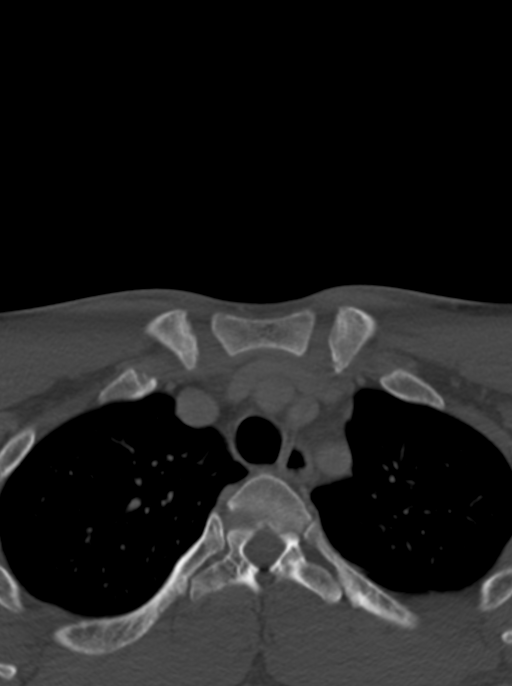

[Series 6: coronal · coronal · 0.52mm/px · 3 of 133 slices shown]
[im 27/133  bone]
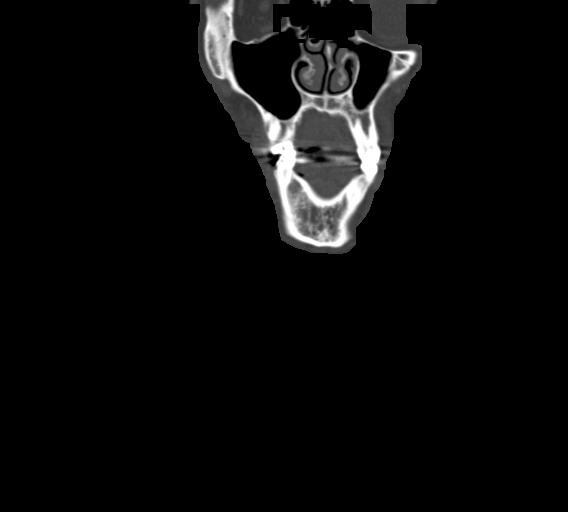
[im 53/133  bone]
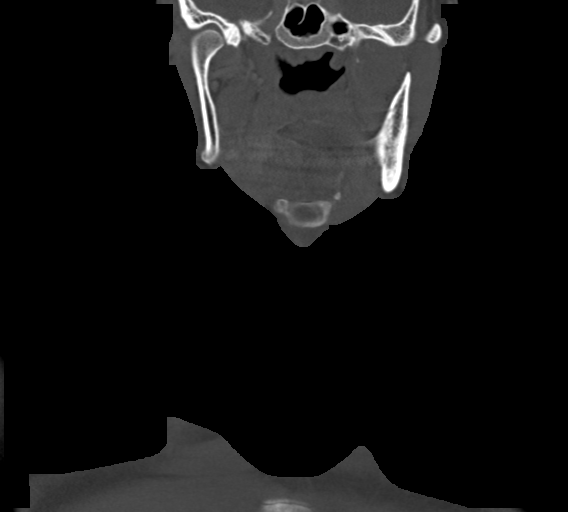
[im 80/133  bone]
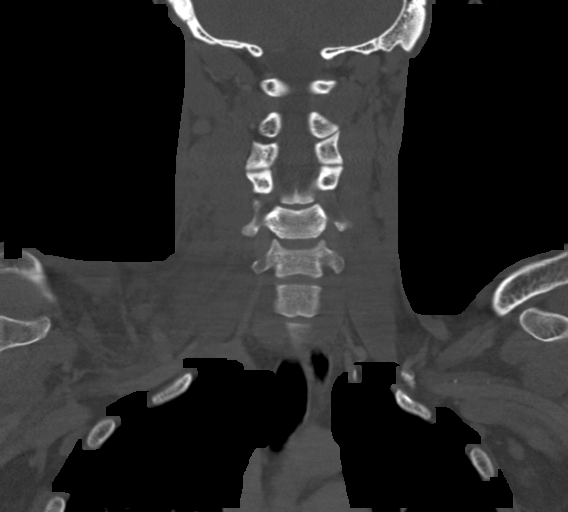

[Series 7: sagittal · sagittal · 0.52mm/px · 5 of 101 slices shown, 6 images]
[im 34/101  bone]
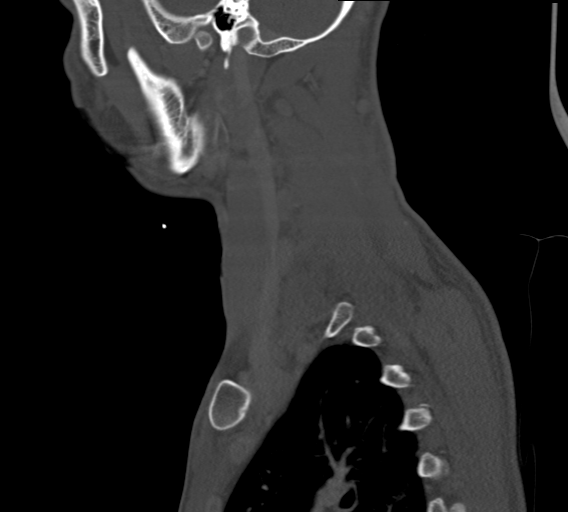
[im 42/101  bone]
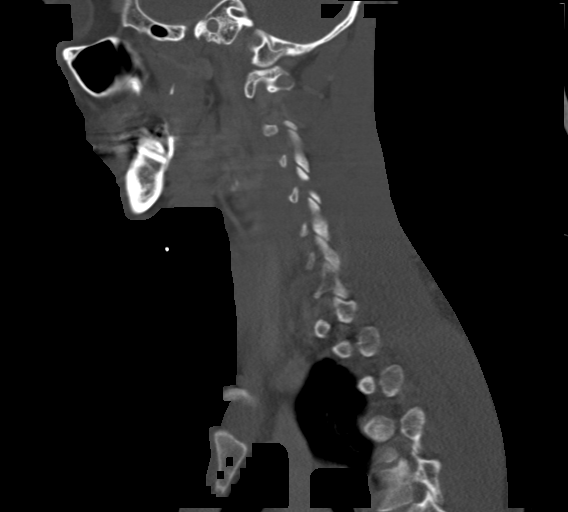
[im 51/101  soft-tissue]
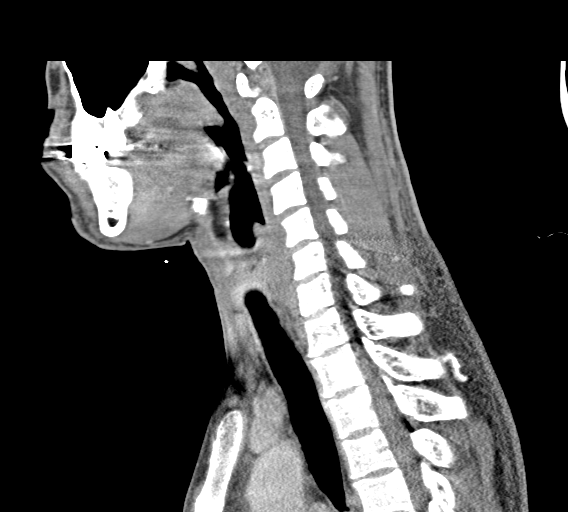
[im 51/101  bone]
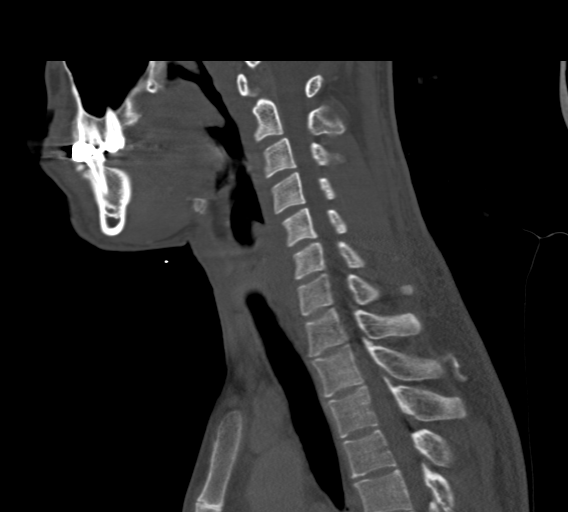
[im 59/101  bone]
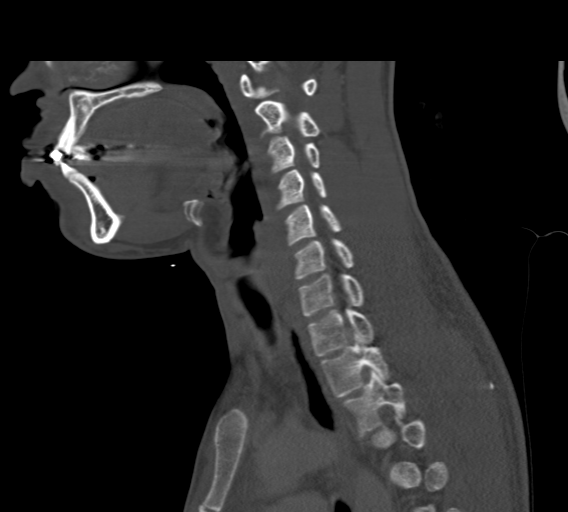
[im 67/101  bone]
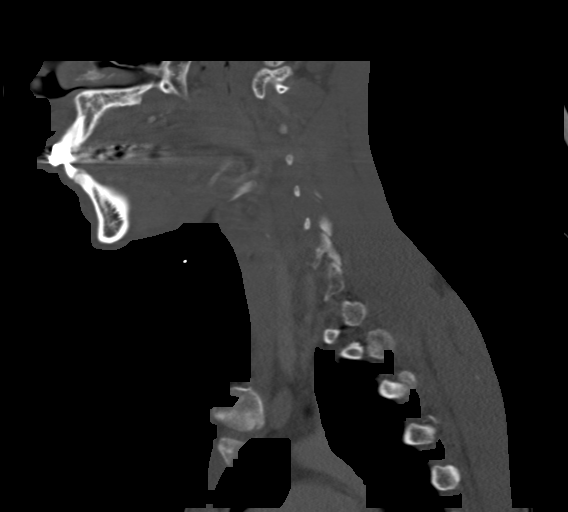

[14 of 33 positions shown; findings below may reference images not displayed]

RADIATION DOSE REDUCTION: This exam was performed according to the
departmental dose-optimization program which includes automated
exposure control, adjustment of the mA and/or kV according to
patient size and/or use of iterative reconstruction technique.

CONTRAST:  60mL OMNIPAQUE IOHEXOL 350 MG/ML SOLN
FINDINGS: Motion and streak artifact present on some slices.

Pharynx and larynx: Unremarkable.  No mass or swelling.

Salivary glands: Parotid and submandibular glands are unremarkable.

Thyroid: Normal.

Lymph nodes: Asymmetric increased number of right internal jugular,
posterior triangle, and supraclavicular lymph nodes. Several of
these are enlarged. For example, right level 5A node measuring
cm on series 2, image 46. There is also a nonenlarged but asymmetric
right suboccipital superficial node.

Vascular: Major neck vessels are patent.

Limited intracranial: No abnormal enhancement.

Visualized orbits: Unremarkable.

Mastoids and visualized paranasal sinuses: No significant
opacification.

Skeleton: No significant osseous abnormality.

Upper chest: Included lungs are clear.

Other: Right posterior neck soft tissue swelling with skin
thickening and infiltration of superficial and deep subcutaneous fat
in the posterior triangle. No evidence of abscess.
IMPRESSION: Asymmetric right sided adenopathy with posterior triangle and
supraclavicular inflammatory changes. No abscess.

## 2022-09-16 ENCOUNTER — Ambulatory Visit
Admission: EM | Admit: 2022-09-16 | Discharge: 2022-09-16 | Disposition: A | Discharge status: Home / Self Care | Payer: 59 | Attending: Family Medicine | Admitting: Family Medicine

## 2022-09-16 DIAGNOSIS — K649 Unspecified hemorrhoids: Secondary | ICD-10-CM

## 2022-09-16 MED ORDER — HYDROCORTISONE ACETATE 25 MG RE SUPP: 25.0000 mg | Freq: Two times a day (BID) | RECTAL | 0 refills | Status: AC

## 2022-09-16 NOTE — ED Triage Notes (Signed)
"  I am having a problem with Hemorrhoids (both internal and external) and having a lot of pain with them".

## 2022-09-16 NOTE — Discharge Instructions (Signed)
Recommend continuous use of the rectal suppository twice daily over the course of the minimally 15 days but can be used up to 1 month to completely shrink hemorrhoids.  Continue using the stool softener to maintain. Continue external hydrocortisone cream as needed.

## 2022-09-17 NOTE — ED Provider Notes (Signed)
[facility]    [name]: [id number] Arrival date & time: 09/16/22  1442      History   Chief Complaint Chief Complaint  Patient presents with   Hemorrhoids    HPI Derek Warren is a 27 y.o. male.   HPI Patient is today for reevaluation of recurrent hemorrhoids.  Patient has been seen for the same problem previously urgent care and has been prescribed suppositories and recommended to continue stool softener as he suffers from chronic constipation which is contributing to current hemorrhoids.  He endorses pain with defecation.  He reports he has internal and external hemorrhoids.  He reports he did get relief of pain with sitz bath's.  He has not frequently use the suppositories as he reports he did not achieve relief when initially using them.  Is not having any rectal bleeding, abdominal pain, nausea or vomiting. Past Medical History:  Diagnosis Date   STD (sexually transmitted disease)     There are no problems to display for this patient.   History reviewed. No pertinent surgical history.     Home Medications    Prior to Admission medications   Medication Sig Start Date End Date Taking? Authorizing Provider  hydrocortisone (ANUSOL-HC) 25 MG suppository Place 1 suppository (25 mg total) rectally 2 (two) times daily. 09/16/22  Yes Bing Neighbors, NP  hydrocortisone cream 1 % Apply to affected area 2 times daily 07/30/22  Yes Stanhope, Donavan Burnet, FNP  docusate sodium (COLACE) 100 MG capsule Take 1 capsule (100 mg total) by mouth daily. 07/11/22   Carlisle Beers, FNP  GAVILAX 17 GM/SCOOP powder Take by mouth. 07/11/22   [provider]  witch hazel-glycerin (TUCKS) pad Apply as needed up to 6 times per day or after each bowel movement.  Use as needed for relief of irritation and itching. 08/08/22   Garrison, [place] N, FNP    Family History Family History  Problem Relation Age of Onset   Cancer Other     Social History Social History    Tobacco Use   Smoking status: Never   Smokeless tobacco: Never  Vaping Use   Vaping status: Never Used  Substance Use Topics   Alcohol use: Yes    Comment: Occassionally   Drug use: Yes    Types: Marijuana    Comment: Weekly.     Allergies   Patient has no known allergies.   Review of Systems Review of Systems Pertinent negatives listed in HPI  Physical Exam Triage Vital Signs ED Triage Vitals  Encounter Vitals Group     BP 09/16/22 1458 116/71     Systolic BP Percentile --      Diastolic BP Percentile --      Pulse Rate 09/16/22 1458 71     Resp 09/16/22 1458 16     Temp 09/16/22 1458 98.2 F (36.8 C)     Temp Source 09/16/22 1458 Oral     SpO2 09/16/22 1458 97 %     Weight 09/16/22 1456 140 lb (63.5 kg)     Height 09/16/22 1456 5\' 10"  (1.778 m)     Head Circumference --      Peak Flow --      Pain Score 09/16/22 1456 2     Pain Loc --      Pain Education --      Exclude from Growth Chart --    No data found.  Updated Vital Signs BP 116/71 (BP Location: Right Arm)  Pulse 71   Temp 98.2 F (36.8 C) (Oral)   Resp 16   Ht 5\' 10"  (1.778 m)   Wt 140 lb (63.5 kg)   SpO2 97%   BMI 20.09 kg/m   Visual Acuity Right Eye Distance:   Left Eye Distance:   Bilateral Distance:    Right Eye Near:   Left Eye Near:    Bilateral Near:     Physical Exam Vitals reviewed. Exam conducted with a chaperone present.  Constitutional:      Appearance: Normal appearance.  HENT:     Head: Normocephalic and atraumatic.  Eyes:     Extraocular Movements: Extraocular movements intact.     Conjunctiva/sclera: Conjunctivae normal.     Pupils: Pupils are equal, round, and reactive to light.  Cardiovascular:     Rate and Rhythm: Normal rate and regular rhythm.  Pulmonary:     Effort: Pulmonary effort is normal.     Breath sounds: Normal breath sounds.  Genitourinary:    Rectum: Tenderness and external hemorrhoid present. No anal fissure.  Neurological:     General:  No focal deficit present.     Mental Status: He is alert.      UC Treatments / Results  Labs (all labs ordered are listed, but only abnormal results are displayed) Labs Reviewed - No data to display  EKG   Radiology No results found.  Procedures Procedures (including critical care time)  Medications Ordered in UC Medications - No data to display  Initial Impression / Assessment and Plan / UC Course  I have reviewed the triage vital signs and the nursing notes.  Pertinent labs & imaging results that were available during my care of the patient were reviewed by me and considered in my medical decision making (see chart for details).    Hemorrhoids, recurrent, treatment per discharge medication orders. Patient to return if symptoms do not improve with treatment. Final Clinical Impressions(s) / UC Diagnoses   Final diagnoses:  Hemorrhoids, unspecified hemorrhoid type     Discharge Instructions      Recommend continuous use of the rectal suppository twice daily over the course of the minimally 15 days but can be used up to 1 month to completely shrink hemorrhoids.  Continue using the stool softener to maintain. Continue external hydrocortisone cream as needed.   ED Prescriptions     Medication Sig Dispense Auth. Provider   hydrocortisone (ANUSOL-HC) 25 MG suppository Place 1 suppository (25 mg total) rectally 2 (two) times daily. 100 suppository Bing Neighbors, NP      PDMP not reviewed this encounter.   Bing Neighbors, NP 09/17/22 [phone]

## 2022-10-08 ENCOUNTER — Ambulatory Visit: Payer: 59 | Admitting: Internal Medicine

## 2022-10-08 ENCOUNTER — Telehealth: Payer: Self-pay | Admitting: General Practice

## 2022-10-08 NOTE — Telephone Encounter (Signed)
8.7.24 new pt no show/ no letter sent/pt block for future schedule

## 2022-10-27 ENCOUNTER — Ambulatory Visit
Admission: EM | Admit: 2022-10-27 | Discharge: 2022-10-27 | Disposition: A | Discharge status: Home / Self Care | Payer: 59 | Attending: Family Medicine | Admitting: Family Medicine

## 2022-10-27 DIAGNOSIS — Z113 Encounter for screening for infections with a predominantly sexual mode of transmission: Secondary | ICD-10-CM

## 2022-10-27 DIAGNOSIS — K644 Residual hemorrhoidal skin tags: Secondary | ICD-10-CM

## 2022-10-27 NOTE — ED Triage Notes (Signed)
"  I have an internal/external hemorrhoids". I have been seen here recently for this and want to make sure it isn't worse". Some active bleeding again with it.

## 2022-10-27 NOTE — ED Provider Notes (Signed)
[name]    [name]: [id number] Arrival date & time: 10/27/22  0840      History   Chief Complaint Chief Complaint  Patient presents with   Hemorrhoids    HPI Derek Warren is a 27 y.o. male.   Patient has h/o internal and external hemorrhoids.  He was seen several months ago. Given suppository, drinking more water, eating better.  He has noted some blood with wiping and a bit in the stool, but not in the water.  Slight irritated with a bm.   He would also like STD testing.  No symptoms, no new exposures.         Past Medical History:  Diagnosis Date   STD (sexually transmitted disease)     There are no problems to display for this patient.   History reviewed. No pertinent surgical history.     Home Medications    Prior to Admission medications   Medication Sig Start Date End Date Taking? Authorizing Provider  hydrocortisone (ANUSOL-HC) 25 MG suppository Place 1 suppository (25 mg total) rectally 2 (two) times daily. 09/16/22  Yes Bing Neighbors, NP  hydrocortisone cream 1 % Apply to affected area 2 times daily 07/30/22  Yes Stanhope, Donavan Burnet, FNP  witch hazel-glycerin (TUCKS) pad Apply as needed up to 6 times per day or after each bowel movement.  Use as needed for relief of irritation and itching. 08/08/22  Yes Rinaldo Ratel, [place] N, FNP  docusate sodium (COLACE) 100 MG capsule Take 1 capsule (100 mg total) by mouth daily. 07/11/22   Carlisle Beers, FNP  GAVILAX 17 GM/SCOOP powder Take by mouth. 07/11/22   [provider]    Family History Family History  Problem Relation Age of Onset   Cancer Other     Social History Social History   Tobacco Use   Smoking status: Never   Smokeless tobacco: Never  Vaping Use   Vaping status: Never Used  Substance Use Topics   Alcohol use: Yes    Comment: Occassionally   Drug use: Yes    Types: Marijuana    Comment: Weekly.     Allergies   Patient has no known  allergies.   Review of Systems Review of Systems  Constitutional: Negative.   HENT: Negative.    Respiratory: Negative.    Cardiovascular: Negative.   Gastrointestinal:  Positive for anal bleeding.  Genitourinary: Negative.      Physical Exam Triage Vital Signs ED Triage Vitals  Encounter Vitals Group     BP 10/27/22 0850 110/70     Systolic BP Percentile --      Diastolic BP Percentile --      Pulse Rate 10/27/22 0850 76     Resp 10/27/22 0850 16     Temp 10/27/22 0850 98.7 F (37.1 C)     Temp Source 10/27/22 0850 Oral     SpO2 10/27/22 0850 97 %     Weight 10/27/22 0849 131 lb (59.4 kg)     Height 10/27/22 0849 5\' 10"  (1.778 m)     Head Circumference --      Peak Flow --      Pain Score 10/27/22 0848 0     Pain Loc --      Pain Education --      Exclude from Growth Chart --    No data found.  Updated Vital Signs BP 110/70 (BP Location: Left Arm)   Pulse 76   Temp 98.7 F (  37.1 C) (Oral)   Resp 16   Ht 5\' 10"  (1.778 m)   Wt 59.4 kg   SpO2 97%   BMI 18.80 kg/m   Visual Acuity Right Eye Distance:   Left Eye Distance:   Bilateral Distance:    Right Eye Near:   Left Eye Near:    Bilateral Near:     Physical Exam Constitutional:      Appearance: Normal appearance.  Cardiovascular:     Rate and Rhythm: Normal rate and regular rhythm.  Pulmonary:     Effort: Pulmonary effort is normal.     Breath sounds: Normal breath sounds.  Skin:    General: Skin is warm.  Neurological:     General: No focal deficit present.     Mental Status: He is alert.  Psychiatric:        Mood and Affect: Mood normal.      UC Treatments / Results  Labs (all labs ordered are listed, but only abnormal results are displayed) Labs Reviewed - No data to display  EKG   Radiology No results found.  Procedures Procedures (including critical care time)  Medications Ordered in UC Medications - No data to display  Initial Impression / Assessment and Plan / UC  Course  I have reviewed the triage vital signs and the nursing notes.  Pertinent labs & imaging results that were available during my care of the patient were reviewed by me and considered in my medical decision making (see chart for details).   Final Clinical Impressions(s) / UC Diagnoses   Final diagnoses:  Screening for STD (sexually transmitted disease)  External hemorrhoid     Discharge Instructions      You were seen today for several issues.  Your STD screening will be resulted tomorrow and you will see results on mychart and will be notified of any positive results.  You do have small external hemorrhoids.  I recommend you make an appointment with a colo-rectal specialist for further care/discussion.  Please call [facility] at [phone] for an appointment.     ED Prescriptions   None    PDMP not reviewed this encounter.   Jannifer Franklin, MD 10/27/22 [phone]

## 2022-10-27 NOTE — Discharge Instructions (Signed)
You were seen today for several issues.  Your STD screening will be resulted tomorrow and you will see results on mychart and will be notified of any positive results.  You do have small external hemorrhoids.  I recommend you make an appointment with a colo-rectal specialist for further care/discussion.  Please call [facility] at [phone] for an appointment.

## 2022-10-28 LAB — RPR: RPR Ser Ql: NONREACTIVE

## 2022-10-28 LAB — HIV ANTIBODY (ROUTINE TESTING W REFLEX): HIV Screen 4th Generation wRfx: NONREACTIVE

## 2022-10-29 LAB — CYTOLOGY, (ORAL, ANAL, URETHRAL) ANCILLARY ONLY
Chlamydia: NEGATIVE
Comment: NEGATIVE
Comment: NEGATIVE
Comment: NORMAL
Neisseria Gonorrhea: NEGATIVE
Trichomonas: NEGATIVE

## 2022-11-07 ENCOUNTER — Encounter: Payer: Self-pay | Admitting: Family Medicine

## 2022-11-07 ENCOUNTER — Other Ambulatory Visit (HOSPITAL_COMMUNITY)
Admission: RE | Admit: 2022-11-07 | Discharge: 2022-11-07 | Disposition: A | Discharge status: Home / Self Care | Payer: 59 | Source: Ambulatory Visit | Attending: Family Medicine | Admitting: Family Medicine

## 2022-11-07 ENCOUNTER — Ambulatory Visit (INDEPENDENT_AMBULATORY_CARE_PROVIDER_SITE_OTHER): Payer: 59 | Admitting: Family Medicine

## 2022-11-07 VITALS — BP 98/58 | HR 70 | Temp 98.4°F | Ht 70.75 in | Wt 167.1 lb

## 2022-11-07 DIAGNOSIS — Z209 Contact with and (suspected) exposure to unspecified communicable disease: Secondary | ICD-10-CM

## 2022-11-07 DIAGNOSIS — Z1159 Encounter for screening for other viral diseases: Secondary | ICD-10-CM

## 2022-11-07 DIAGNOSIS — Z114 Encounter for screening for human immunodeficiency virus [HIV]: Secondary | ICD-10-CM

## 2022-11-07 DIAGNOSIS — K644 Residual hemorrhoidal skin tags: Secondary | ICD-10-CM | POA: Diagnosis not present

## 2022-11-07 NOTE — Progress Notes (Signed)
New Patient Office Visit  Subjective    Patient ID: Derek Warren, male    DOB: 05-19-1995  Age: 27 y.o. MRN: [id number]  CC:  Chief Complaint  Patient presents with   Establish Care   patient requests STD testing-denies exposure    HPI Derek Warren presents to establish care Patient has been seen a few times in the urgent care for his hemorrhoids. States that he was seen a few times in the urgent care, had anal fissure in 04/2021, states that he is changing his diet and eating more fiber, also using his suppositories occasionally for irritation and bleeding. States that his bowel movements have improved since he was seen in the ED. Counseled patient on bowel regimen that would help.   I have reviewed all aspects of the patient's medical history including social, family, and surgical history.   Current Outpatient Medications  Medication Instructions   docusate sodium (COLACE) 100 mg, Oral, Daily   hydrocortisone (ANUSOL-HC) 25 mg, Rectal, 2 times daily   hydrocortisone cream 1 % Apply to affected area 2 times daily    Past Medical History:  Diagnosis Date   STD (sexually transmitted disease)     History reviewed. No pertinent surgical history.  Family History  Problem Relation Age of Onset   Cancer Other     Social History   Socioeconomic History   Marital status: Single    Spouse name: Not on file   Number of children: Not on file   Years of education: Not on file   Highest education level: Not on file  Occupational History   Not on file  Tobacco Use   Smoking status: Never   Smokeless tobacco: Never  Vaping Use   Vaping status: Never Used  Substance and Sexual Activity   Alcohol use: Yes    Comment: Occassionally   Drug use: Yes    Types: Marijuana    Comment: Weekly.   Sexual activity: Yes    Birth control/protection: None  Other Topics Concern   Not on file  Social History Narrative   Not on file   Social Determinants of Health   Financial  Resource Strain: Not on file  Food Insecurity: Not on file  Transportation Needs: Not on file  Physical Activity: Not on file  Stress: Not on file  Social Connections: Not on file  Intimate Partner Violence: Not on file    Review of Systems  All other systems reviewed and are negative.       Objective    BP (!) 98/58 (BP Location: Left Arm, Patient Position: Sitting, Cuff Size: Normal)   Pulse 70   Temp 98.4 F (36.9 C) (Oral)   Ht 5' 10.75" (1.797 m)   Wt 167 lb 1.6 oz (75.8 kg)   SpO2 98%   BMI 23.47 kg/m   Physical Exam Vitals reviewed.  Constitutional:      Appearance: Normal appearance. He is well-groomed and normal weight.  Eyes:     Conjunctiva/sclera: Conjunctivae normal.  Cardiovascular:     Rate and Rhythm: Normal rate and regular rhythm.     Heart sounds: S1 normal and S2 normal. No murmur heard. Pulmonary:     Effort: Pulmonary effort is normal.     Breath sounds: Normal breath sounds and air entry. No rales.  Genitourinary:    Rectum: External hemorrhoid present.       Comments: Chaperone was offered to the patient who declined. Two hemorrhoids noted on exam, no signs  of inflammation or bleeding, no fissures. Musculoskeletal:     Right lower leg: No edema.     Left lower leg: No edema.  Neurological:     General: No focal deficit present.     Mental Status: He is alert and oriented to person, place, and time.     Gait: Gait is intact.  Psychiatric:        Mood and Affect: Mood and affect normal.         Assessment & Plan:  Need for hepatitis C screening test -     Hepatitis C antibody  External hemorrhoids Assessment & Plan: Continue cortisone cream and suppositories as needed. Counseled patient on a bowel regimen. If they continue to worsen then I would recommend referral to GI.    Encounter for screening for HIV -     HIV Antibody (routine testing w rflx)  Exposure to communicable disease -     RPR -     Urine cytology ancillary  only  Pt also requesting STI testing. He does not currently have any symptoms but has had exposure.  Return in about 6 months (around 05/07/2023) for annual physical exam -- at patient's convenience.   Karie Georges, MD

## 2022-11-07 NOTE — Patient Instructions (Addendum)
Miralax-- 1 spoonful every day  Probiotics - 1 tablet daily with breakfast  Metamucil packets once daily  Witch hazel pads (Tucks) pads

## 2022-11-07 NOTE — Assessment & Plan Note (Signed)
Continue cortisone cream and suppositories as needed. Counseled patient on a bowel regimen. If they continue to worsen then I would recommend referral to GI.

## 2022-11-08 LAB — RPR: RPR Ser Ql: NONREACTIVE

## 2022-11-08 LAB — HIV ANTIBODY (ROUTINE TESTING W REFLEX): HIV 1&2 Ab, 4th Generation: NONREACTIVE

## 2022-11-08 LAB — HEPATITIS C ANTIBODY: Hepatitis C Ab: NONREACTIVE

## 2022-11-10 LAB — URINE CYTOLOGY ANCILLARY ONLY
Chlamydia: NEGATIVE
Comment: NEGATIVE
Comment: NEGATIVE
Comment: NORMAL
Neisseria Gonorrhea: NEGATIVE
Trichomonas: NEGATIVE

## 2022-12-04 ENCOUNTER — Ambulatory Visit
Admission: EM | Admit: 2022-12-04 | Discharge: 2022-12-04 | Disposition: A | Discharge status: Home / Self Care | Payer: 59 | Attending: Physician Assistant | Admitting: Physician Assistant

## 2022-12-04 DIAGNOSIS — Z209 Contact with and (suspected) exposure to unspecified communicable disease: Secondary | ICD-10-CM | POA: Insufficient documentation

## 2022-12-04 DIAGNOSIS — Z114 Encounter for screening for human immunodeficiency virus [HIV]: Secondary | ICD-10-CM | POA: Insufficient documentation

## 2022-12-04 DIAGNOSIS — Z113 Encounter for screening for infections with a predominantly sexual mode of transmission: Secondary | ICD-10-CM | POA: Diagnosis present

## 2022-12-04 DIAGNOSIS — Z1159 Encounter for screening for other viral diseases: Secondary | ICD-10-CM | POA: Diagnosis not present

## 2022-12-04 NOTE — ED Triage Notes (Signed)
"  I want to get STI checkup". "I just slept with someone for the first time and want to make sure I am disease free". No dysuria. No penis discharge.

## 2022-12-04 NOTE — ED Provider Notes (Signed)
[name]    [name]: [id number] Arrival date & time: 12/04/22  1634      History   Chief Complaint Chief Complaint  Patient presents with   SEXUALLY TRANSMITTED DISEASE    Testing    HPI Amere Bricco is a 27 y.o. male.   Patient here today for STD screening.  He denies any current symptoms.  He states he has had a new sexual partner and just wants to be sure that he is "disease-free".  The history is provided by the patient.    Past Medical History:  Diagnosis Date   STD (sexually transmitted disease)     Patient Active Problem List   Diagnosis Date Noted   External hemorrhoids 11/07/2022   Cervical adenopathy 04/01/2021    History reviewed. No pertinent surgical history.     Home Medications    Prior to Admission medications   Medication Sig Start Date End Date Taking? Authorizing Provider  Hydrocortisone, Perianal, 1 % CREA Apply 1 Application topically 2 (two) times daily. 07/30/22  Yes [provider]  metroNIDAZOLE (FLAGYL) 500 MG tablet Take 2,000 mg by mouth once. 08/12/22  Yes [provider]  docusate sodium (COLACE) 100 MG capsule Take 1 capsule (100 mg total) by mouth daily. 07/11/22   Carlisle Beers, FNP  hydrocortisone (ANUSOL-HC) 25 MG suppository Place 1 suppository (25 mg total) rectally 2 (two) times daily. 09/16/22   Bing Neighbors, NP  hydrocortisone cream 1 % Apply to affected area 2 times daily 07/30/22   Carlisle Beers, FNP    Family History Family History  Problem Relation Age of Onset   Cancer Other     Social History Social History   Tobacco Use   Smoking status: Never   Smokeless tobacco: Never  Vaping Use   Vaping status: Never Used  Substance Use Topics   Alcohol use: Yes    Comment: Occassionally   Drug use: Yes    Frequency: 1.0 times per week    Types: Marijuana     Allergies   Patient has no known allergies.   Review of Systems Review of Systems   Constitutional:  Negative for chills and fever.  Eyes:  Negative for discharge and redness.  Respiratory:  Negative for shortness of breath.   Genitourinary:  Negative for dysuria, genital sores and penile discharge.  Neurological:  Negative for numbness.     Physical Exam Triage Vital Signs ED Triage Vitals  Encounter Vitals Group     BP      Systolic BP Percentile      Diastolic BP Percentile      Pulse      Resp      Temp      Temp src      SpO2      Weight      Height      Head Circumference      Peak Flow      Pain Score      Pain Loc      Pain Education      Exclude from Growth Chart    No data found.  Updated Vital Signs BP 111/69 (BP Location: Left Arm)   Pulse 80   Temp 98.6 F (37 C) (Oral)   Resp 18   Ht 5\' 11"  (1.803 m)   Wt 140 lb (63.5 kg)   SpO2 98%   BMI 19.53 kg/m      Physical Exam Vitals and nursing  note reviewed.  Constitutional:      General: He is not in acute distress.    Appearance: Normal appearance. He is not ill-appearing.  HENT:     Head: Normocephalic and atraumatic.  Eyes:     Conjunctiva/sclera: Conjunctivae normal.  Cardiovascular:     Rate and Rhythm: Normal rate.  Pulmonary:     Effort: Pulmonary effort is normal. No respiratory distress.  Neurological:     Mental Status: He is alert.  Psychiatric:        Mood and Affect: Mood normal.        Behavior: Behavior normal.        Thought Content: Thought content normal.      UC Treatments / Results  Labs (all labs ordered are listed, but only abnormal results are displayed) Labs Reviewed  CYTOLOGY, (ORAL, ANAL, URETHRAL) ANCILLARY ONLY    EKG   Radiology No results found.  Procedures Procedures (including critical care time)  Medications Ordered in UC Medications - No data to display  Initial Impression / Assessment and Plan / UC Course  I have reviewed the triage vital signs and the nursing notes.  Pertinent labs & imaging results that were  available during my care of the patient were reviewed by me and considered in my medical decision making (see chart for details).    STD screening ordered as requested.  Will await results further recommendation.  Encouraged follow-up with any further concerns.  Final Clinical Impressions(s) / UC Diagnoses   Final diagnoses:  Screening for STD (sexually transmitted disease)   Discharge Instructions   None    ED Prescriptions   None    PDMP not reviewed this encounter.   Tomi Bamberger, PA-C 12/04/22 1719

## 2022-12-08 LAB — CYTOLOGY, (ORAL, ANAL, URETHRAL) ANCILLARY ONLY
Chlamydia: NEGATIVE
Comment: NEGATIVE
Comment: NEGATIVE
Comment: NORMAL
Neisseria Gonorrhea: NEGATIVE
Trichomonas: NEGATIVE

## 2022-12-31 ENCOUNTER — Ambulatory Visit: Payer: 59 | Admitting: Family Medicine

## 2023-02-13 ENCOUNTER — Ambulatory Visit: Payer: 59 | Admitting: Family Medicine

## 2023-08-10 ENCOUNTER — Encounter: Payer: Self-pay | Admitting: Emergency Medicine

## 2023-08-10 ENCOUNTER — Ambulatory Visit
Admission: EM | Admit: 2023-08-10 | Discharge: 2023-08-10 | Disposition: A | Discharge status: Home / Self Care | Attending: Physician Assistant | Admitting: Physician Assistant

## 2023-08-10 DIAGNOSIS — M545 Low back pain, unspecified: Secondary | ICD-10-CM | POA: Diagnosis not present

## 2023-08-10 DIAGNOSIS — Z113 Encounter for screening for infections with a predominantly sexual mode of transmission: Secondary | ICD-10-CM | POA: Diagnosis not present

## 2023-08-10 MED ORDER — LIDOCAINE 5 % EX PTCH: 1.0000 | MEDICATED_PATCH | CUTANEOUS | 0 refills | Status: AC

## 2023-08-10 MED ORDER — NAPROXEN 375 MG PO TABS: 375.0000 mg | ORAL_TABLET | Freq: Two times a day (BID) | ORAL | 0 refills | Status: AC

## 2023-08-10 NOTE — Discharge Instructions (Signed)
 We will contact you if we need to arrange any treatment based on your STI results.  Abstain from sex until you receive result.  Use a condom with each sexual encounter.  If you develop any pelvic pain, penile discharge, burning when you pee, genital lesions he should be seen immediately.  I believe that your back pain is related to muscles in your back.  Apply lidocaine  patch during the day and then remove this at night.  Use only 1 patch per 24 hours.  Take naproxen  up to twice a day.  Do not take additional NSAIDs with this medication including aspirin, ibuprofen /Advil , naproxen /Aleve .  You can use Tylenol /acetaminophen  as needed.  If you are having any worsening symptoms including increasing pain, numbness or tingling in your legs, fever, difficulty walking, going to the bathroom and or self without noticing it you need to be seen immediately.

## 2023-08-10 NOTE — ED Triage Notes (Signed)
 Pt presents requesting STI testing including bloodwork. Pt denies having any sxs.

## 2023-08-10 NOTE — ED Provider Notes (Signed)
 [name]    [name]: [id number] Arrival date & time: 08/10/23  1143      History   Chief Complaint Chief Complaint  Patient presents with   Exposure to STD    HPI Derek Warren is a 28 y.o. male.   Patient presents today with several concerns.  His primary concern today is STI testing.  He is sexually active male partners but does not always use condoms.  He is requesting complete STI panel.  He denies any penile discharge, genital lesion, fever, nausea, vomiting, dysuria.  Denies any recent antibiotics.  In addition, he reports a month-long history of intermittent lower back pain.  This is primarily happening whenever he is standing for more than 5 minutes.  He does not stand at work; works as a [employer].  During these episodes pain is localized to his lower back in the midline region without radiation, described as aching, no aggravating leaving factors notified.  Denies previous injury or surgery involving his back.  Denies personal history of malignancy.  Denies any bowel/bladder incontinence, lower extremity weakness, saddle anesthesia.  He has not been taking any over-the-counter medication for symptom management.    Past Medical History:  Diagnosis Date   STD (sexually transmitted disease)     Patient Active Problem List   Diagnosis Date Noted   External hemorrhoids 11/07/2022   Cervical adenopathy 04/01/2021    History reviewed. No pertinent surgical history.     Home Medications    Prior to Admission medications   Medication Sig Start Date End Date Taking? Authorizing Provider  lidocaine  (LIDODERM ) 5 % Place 1 patch onto the skin daily. Remove & Discard patch within 12 hours or as directed by MD 08/10/23  Yes Reshad Saab K, PA-C  naproxen  (NAPROSYN ) 375 MG tablet Take 1 tablet (375 mg total) by mouth 2 (two) times daily. 08/10/23  Yes Erma Joubert, Betsey Brow, PA-C  docusate sodium  (COLACE) 100 MG capsule Take 1 capsule (100 mg total) by mouth daily.  07/11/22   Starlene Eaton, FNP  hydrocortisone  (ANUSOL -HC) 25 MG suppository Place 1 suppository (25 mg total) rectally 2 (two) times daily. 09/16/22   Buena Carmine, NP  hydrocortisone  cream 1 % Apply to affected area 2 times daily 07/30/22   Starlene Eaton, FNP  Hydrocortisone , Perianal, 1 % CREA Apply 1 Application topically 2 (two) times daily. 07/30/22   [provider]    Family History Family History  Problem Relation Age of Onset   Cancer Other     Social History Social History   Tobacco Use   Smoking status: Never    Passive exposure: Never   Smokeless tobacco: Never  Vaping Use   Vaping status: Never Used  Substance Use Topics   Alcohol use: Yes    Comment: Occassionally   Drug use: Yes    Frequency: 1.0 times per week    Types: Marijuana     Allergies   Patient has no known allergies.   Review of Systems Review of Systems  Constitutional:  Positive for activity change. Negative for appetite change, fatigue and fever.  Gastrointestinal:  Negative for abdominal pain, diarrhea, nausea and vomiting.  Genitourinary:  Negative for dysuria, frequency, genital sores, penile discharge, penile pain and urgency.  Musculoskeletal:  Positive for back pain. Negative for arthralgias and myalgias.     Physical Exam Triage Vital Signs ED Triage Vitals  Encounter Vitals Group     BP 08/10/23 1251 (!) 147/87  Systolic BP Percentile --      Diastolic BP Percentile --      Pulse Rate 08/10/23 1251 85     Resp 08/10/23 1251 16     Temp 08/10/23 1251 98.2 F (36.8 C)     Temp Source 08/10/23 1251 Oral     SpO2 08/10/23 1251 98 %     Weight 08/10/23 1250 139 lb 15.9 oz (63.5 kg)     Height --      Head Circumference --      Peak Flow --      Pain Score 08/10/23 1250 0     Pain Loc --      Pain Education --      Exclude from Growth Chart --    No data found.  Updated Vital Signs BP (!) 147/87 (BP Location: Left Arm)   Pulse 85   Temp  98.2 F (36.8 C) (Oral)   Resp 16   Wt 139 lb 15.9 oz (63.5 kg)   SpO2 98%   BMI 19.52 kg/m   Visual Acuity Right Eye Distance:   Left Eye Distance:   Bilateral Distance:    Right Eye Near:   Left Eye Near:    Bilateral Near:     Physical Exam Vitals reviewed.  Constitutional:      General: He is awake.     Appearance: Normal appearance. He is well-developed. He is not ill-appearing.     Comments: Very pleasant male appears stated age in no acute distress sitting comfortably in exam room  HENT:     Head: Normocephalic and atraumatic.  Cardiovascular:     Rate and Rhythm: Normal rate and regular rhythm.     Heart sounds: Normal heart sounds, S1 normal and S2 normal. No murmur heard. Pulmonary:     Effort: Pulmonary effort is normal.     Breath sounds: Normal breath sounds. No stridor. No wheezing, rhonchi or rales.     Comments: Clear to auscultation bilaterally Abdominal:     General: Bowel sounds are normal.     Palpations: Abdomen is soft.     Tenderness: There is no abdominal tenderness.     Comments: Benign abdominal exam  Musculoskeletal:     Cervical back: No tenderness or bony tenderness.     Thoracic back: No tenderness or bony tenderness.     Lumbar back: No tenderness or bony tenderness.     Comments: Back: No pain percussion of vertebrae.  No tenderness palpation of paraspinal muscles.  No deformity or step-off noted.  Strength 5/5 bilateral lower extremities.  Neurological:     Mental Status: He is alert.  Psychiatric:        Behavior: Behavior is cooperative.      UC Treatments / Results  Labs (all labs ordered are listed, but only abnormal results are displayed) Labs Reviewed  HIV ANTIBODY (ROUTINE TESTING W REFLEX)  RPR  CYTOLOGY, (ORAL, ANAL, URETHRAL) ANCILLARY ONLY    EKG   Radiology No results found.  Procedures Procedures (including critical care time)  Medications Ordered in UC Medications - No data to display  Initial  Impression / Assessment and Plan / UC Course  I have reviewed the triage vital signs and the nursing notes.  Pertinent labs & imaging results that were available during my care of the patient were reviewed by me and considered in my medical decision making (see chart for details).     Patient is well-appearing, afebrile, nontoxic, nontachycardic.  STI testing was obtained and is pending.  We will contact him if need to arrange any treatment.  He is to abstain from sex until he receives results.  Discussed the importance of safe sex practices.  If he develops any symptoms he is to return for reevaluation.  I suspect lower back pain is musculoskeletal in nature.  Plain films were deferred as he denies any recent trauma and has no focal bony tenderness.  He was given lidocaine  patches to apply during the day to help with his pain and discussed that he should only use 1 patch per 24 hours.  He can take Naprosyn  for pain relief and we discussed that he is not to take NSAIDs with this medication and risk of GI bleeding.  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  He can use heat and gentle stretch for symptom relief.  We discussed that if he has any worsening or changing symptoms he needs to be seen immediately.  Strict return precautions given.  Excuse note provided.  Final Clinical Impressions(s) / UC Diagnoses   Final diagnoses:  Screening examination for STI  Acute midline low back pain without sciatica     Discharge Instructions      We will contact you if we need to arrange any treatment based on your STI results.  Abstain from sex until you receive result.  Use a condom with each sexual encounter.  If you develop any pelvic pain, penile discharge, burning when you pee, genital lesions he should be seen immediately.  I believe that your back pain is related to muscles in your back.  Apply lidocaine  patch during  the day and then remove this at night.  Use only 1 patch per 24 hours.  Take naproxen  up to twice a day.  Do not take additional NSAIDs with this medication including aspirin, ibuprofen /Advil , naproxen /Aleve .  You can use Tylenol /acetaminophen  as needed.  If you are having any worsening symptoms including increasing pain, numbness or tingling in your legs, fever, difficulty walking, going to the bathroom and or self without noticing it you need to be seen immediately.   ED Prescriptions     Medication Sig Dispense Auth. Provider   lidocaine  (LIDODERM ) 5 % Place 1 patch onto the skin daily. Remove & Discard patch within 12 hours or as directed by MD 10 patch Tifany Hirsch K, PA-C   naproxen  (NAPROSYN ) 375 MG tablet Take 1 tablet (375 mg total) by mouth 2 (two) times daily. 20 tablet Yari Szeliga K, PA-C      PDMP not reviewed this encounter.   Budd Cargo, PA-C 08/10/23 1320

## 2023-08-11 LAB — CYTOLOGY, (ORAL, ANAL, URETHRAL) ANCILLARY ONLY
Chlamydia: NEGATIVE
Comment: NEGATIVE
Comment: NEGATIVE
Comment: NORMAL
Neisseria Gonorrhea: NEGATIVE
Trichomonas: NEGATIVE

## 2023-08-11 LAB — HIV ANTIBODY (ROUTINE TESTING W REFLEX): HIV Screen 4th Generation wRfx: NONREACTIVE

## 2023-08-11 LAB — RPR: RPR Ser Ql: NONREACTIVE

## 2023-08-24 ENCOUNTER — Encounter: Admitting: Family Medicine

## 2023-08-28 ENCOUNTER — Ambulatory Visit (INDEPENDENT_AMBULATORY_CARE_PROVIDER_SITE_OTHER): Admitting: Family Medicine

## 2023-08-28 ENCOUNTER — Ambulatory Visit: Admitting: Family Medicine

## 2023-08-28 ENCOUNTER — Encounter: Payer: Self-pay | Admitting: Family Medicine

## 2023-08-28 VITALS — BP 112/70 | HR 115 | Temp 98.6°F | Ht 72.5 in | Wt 198.8 lb

## 2023-08-28 DIAGNOSIS — Z13220 Encounter for screening for lipoid disorders: Secondary | ICD-10-CM | POA: Diagnosis not present

## 2023-08-28 DIAGNOSIS — Z0000 Encounter for general adult medical examination without abnormal findings: Secondary | ICD-10-CM

## 2023-08-28 NOTE — Patient Instructions (Signed)
 Health Maintenance, Male  Adopting a healthy lifestyle and getting preventive care are important in promoting health and wellness. Ask your health care provider about:  The right schedule for you to have regular tests and exams.  Things you can do on your own to prevent diseases and keep yourself healthy.  What should I know about diet, weight, and exercise?  Eat a healthy diet    Eat a diet that includes plenty of vegetables, fruits, low-fat dairy products, and lean protein.  Do not eat a lot of foods that are high in solid fats, added sugars, or sodium.  Maintain a healthy weight  Body mass index (BMI) is a measurement that can be used to identify possible weight problems. It estimates body fat based on height and weight. Your health care provider can help determine your BMI and help you achieve or maintain a healthy weight.  Get regular exercise  Get regular exercise. This is one of the most important things you can do for your health. Most adults should:  Exercise for at least 150 minutes each week. The exercise should increase your heart rate and make you sweat (moderate-intensity exercise).  Do strengthening exercises at least twice a week. This is in addition to the moderate-intensity exercise.  Spend less time sitting. Even light physical activity can be beneficial.  Watch cholesterol and blood lipids  Have your blood tested for lipids and cholesterol at 29 years of age, then have this test every 5 years.  You may need to have your cholesterol levels checked more often if:  Your lipid or cholesterol levels are high.  You are older than 29 years of age.  You are at high risk for heart disease.  What should I know about cancer screening?  Many types of cancers can be detected early and may often be prevented. Depending on your health history and family history, you may need to have cancer screening at various ages. This may include screening for:  Colorectal cancer.  Prostate cancer.  Skin cancer.  Lung  cancer.  What should I know about heart disease, diabetes, and high blood pressure?  Blood pressure and heart disease  High blood pressure causes heart disease and increases the risk of stroke. This is more likely to develop in people who have high blood pressure readings or are overweight.  Talk with your health care provider about your target blood pressure readings.  Have your blood pressure checked:  Every 3-5 years if you are 9-95 years of age.  Every year if you are 85 years old or older.  If you are between the ages of 29 and 29 and are a current or former smoker, ask your health care provider if you should have a one-time screening for abdominal aortic aneurysm (AAA).  Diabetes  Have regular diabetes screenings. This checks your fasting blood sugar level. Have the screening done:  Once every three years after age 23 if you are at a normal weight and have a low risk for diabetes.  More often and at a younger age if you are overweight or have a high risk for diabetes.  What should I know about preventing infection?  Hepatitis B  If you have a higher risk for hepatitis B, you should be screened for this virus. Talk with your health care provider to find out if you are at risk for hepatitis B infection.  Hepatitis C  Blood testing is recommended for:  Everyone born from [birth year] through 1966.  Anyone  with known risk factors for hepatitis C.  Sexually transmitted infections (STIs)  You should be screened each year for STIs, including gonorrhea and chlamydia, if:  You are sexually active and are younger than 29 years of age.  You are older than 29 years of age and your health care provider tells you that you are at risk for this type of infection.  Your sexual activity has changed since you were last screened, and you are at increased risk for chlamydia or gonorrhea. Ask your health care provider if you are at risk.  Ask your health care provider about whether you are at high risk for HIV. Your health care provider  may recommend a prescription medicine to help prevent HIV infection. If you choose to take medicine to prevent HIV, you should first get tested for HIV. You should then be tested every 3 months for as long as you are taking the medicine.  Follow these instructions at home:  Alcohol use  Do not drink alcohol if your health care provider tells you not to drink.  If you drink alcohol:  Limit how much you have to 0-2 drinks a day.  Know how much alcohol is in your drink. In the [place] one drink equals one 12 oz bottle of beer (355 mL), one 5 oz glass of wine (148 mL), or one 1 oz glass of hard liquor (44 mL).  Lifestyle  Do not use any products that contain nicotine or tobacco. These products include cigarettes, chewing tobacco, and vaping devices, such as e-cigarettes. If you need help quitting, ask your health care provider.  Do not use street drugs.  Do not share needles.  Ask your health care provider for help if you need support or information about quitting drugs.  General instructions  Schedule regular health, dental, and eye exams.  Stay current with your vaccines.  Tell your health care provider if:  You often feel depressed.  You have ever been abused or do not feel safe at home.  Summary  Adopting a healthy lifestyle and getting preventive care are important in promoting health and wellness.  Follow your health care provider's instructions about healthy diet, exercising, and getting tested or screened for diseases.  Follow your health care provider's instructions on monitoring your cholesterol and blood pressure.  This information is not intended to replace advice given to you by your health care provider. Make sure you discuss any questions you have with your health care provider.  Document Revised: 07/09/2020 Document Reviewed: 07/09/2020  Elsevier Patient Education  2024 [employer].

## 2023-08-28 NOTE — Progress Notes (Unsigned)
 Complete physical exam  Patient: Derek Warren   DOB: June 09, 1995   28 y.o. Male  MRN: [id number]  Subjective:    Chief Complaint  Patient presents with  . Annual Exam    Derek Warren is a 28 y.o. male who presents today for a complete physical exam. He reports consuming a general diet. The patient does not participate in regular exercise at present. He generally feels well. He reports sleeping well. He does not have additional problems to discuss today.    Most recent fall risk assessment:    07/15/2014    8:21 AM  Fall Risk   Falls in the past year? No      Data saved with a previous flowsheet row definition     Most recent depression screenings:    11/07/2022    8:25 AM 03/29/2015    8:24 AM  PHQ 2/9 Scores  PHQ - 2 Score 0 0  PHQ- 9 Score 0     Vision:Not within last year  and Dental: No current dental problems and Receives regular dental care  Patient Active Problem List   Diagnosis Date Noted  . External hemorrhoids 11/07/2022  . Cervical adenopathy 04/01/2021      Patient Care Team: Ozell Derek HERO, MD as PCP - General (Family Medicine)   Outpatient Medications Prior to Visit  Medication Sig  . docusate sodium  (COLACE) 100 MG capsule Take 1 capsule (100 mg total) by mouth daily.  . hydrocortisone  (ANUSOL -HC) 25 MG suppository Place 1 suppository (25 mg total) rectally 2 (two) times daily.  . hydrocortisone  cream 1 % Apply to affected area 2 times daily  . Hydrocortisone , Perianal, 1 % CREA Apply 1 Application topically 2 (two) times daily.  . lidocaine  (LIDODERM ) 5 % Place 1 patch onto the skin daily. Remove & Discard patch within 12 hours or as directed by MD  . naproxen  (NAPROSYN ) 375 MG tablet Take 1 tablet (375 mg total) by mouth 2 (two) times daily.   No facility-administered medications prior to visit.    Review of Systems  HENT:  Negative for hearing loss.   Eyes:  Negative for blurred vision.  Respiratory:  Negative for shortness of  breath.   Cardiovascular:  Negative for chest pain.  Gastrointestinal: Negative.   Genitourinary: Negative.   Musculoskeletal:  Negative for back pain.  Neurological:  Negative for headaches.  Psychiatric/Behavioral:  Negative for depression.        Objective:     BP 112/70   Pulse (!) 115   Temp 98.6 F (37 C) (Oral)   Ht 6' 0.5 (1.842 m)   Wt 198 lb 12.8 oz (90.2 kg)   SpO2 97%   BMI 26.59 kg/m  {Vitals History (Optional):23777}  Physical Exam Vitals reviewed.  Constitutional:      Appearance: Normal appearance. He is well-groomed and normal weight.  HENT:     Right Ear: Tympanic membrane and ear canal normal.     Left Ear: Tympanic membrane and ear canal normal.     Mouth/Throat:     Mouth: Mucous membranes are moist.     Pharynx: No posterior oropharyngeal erythema.   Eyes:     Extraocular Movements: Extraocular movements intact.     Conjunctiva/sclera: Conjunctivae normal.   Neck:     Thyroid: No thyromegaly.   Cardiovascular:     Rate and Rhythm: Normal rate and regular rhythm.     Heart sounds: S1 normal and S2 normal. No murmur heard. Pulmonary:  Effort: Pulmonary effort is normal.     Breath sounds: Normal breath sounds and air entry. No rales.  Abdominal:     General: Abdomen is flat. Bowel sounds are normal.   Musculoskeletal:     Right lower leg: No edema.     Left lower leg: No edema.  Lymphadenopathy:     Cervical: No cervical adenopathy.   Neurological:     General: No focal deficit present.     Mental Status: He is alert and oriented to person, place, and time.     Gait: Gait is intact.   Psychiatric:        Mood and Affect: Mood and affect normal.     No results found for any visits on 08/28/23. {Show previous labs (optional):23779}    Assessment & Plan:    Routine Health Maintenance and Physical Exam  Immunization History  Administered Date(s) Administered  . Influenza,inj,Quad PF,6+ Mos 02/08/2015    Health  Maintenance  Topic Date Due  . COVID-19 Vaccine (1) Never done  . DTaP/Tdap/Td (1 - Tdap) Never done  . Hepatitis B Vaccines (1 of 3 - 19+ 3-dose series) Never done  . HPV VACCINES (1 - Risk 3-dose SCDM series) Never done  . INFLUENZA VACCINE  10/02/2023  . Hepatitis C Screening  Completed  . HIV Screening  Completed  . Meningococcal B Vaccine  Aged Out    Discussed health benefits of physical activity, and encouraged him to engage in regular exercise appropriate for his age and condition.  Lipid screening -     Lipid panel; Future  Routine general medical examination at a health care facility -     CBC with Differential/Platelet; Future -     Comprehensive metabolic panel with GFR; Future    Return in 1 year (on 08/27/2024).     Derek CHRISTELLA Sharper, MD

## 2023-08-29 ENCOUNTER — Ambulatory Visit: Payer: Self-pay | Admitting: Family Medicine

## 2023-08-29 LAB — COMPREHENSIVE METABOLIC PANEL WITH GFR
ALT: 33 IU/L (ref 0–44)
AST: 26 IU/L (ref 0–40)
Albumin: 4.6 g/dL (ref 4.3–5.2)
Alkaline Phosphatase: 79 IU/L (ref 44–121)
BUN/Creatinine Ratio: 12 (ref 9–20)
BUN: 14 mg/dL (ref 6–20)
Bilirubin Total: 0.7 mg/dL (ref 0.0–1.2)
CO2: 21 mmol/L (ref 20–29)
Calcium: 9.6 mg/dL (ref 8.7–10.2)
Chloride: 102 mmol/L (ref 96–106)
Creatinine, Ser: 1.13 mg/dL (ref 0.76–1.27)
Globulin, Total: 2.7 g/dL (ref 1.5–4.5)
Glucose: 99 mg/dL (ref 70–99)
Potassium: 4.2 mmol/L (ref 3.5–5.2)
Sodium: 140 mmol/L (ref 134–144)
Total Protein: 7.3 g/dL (ref 6.0–8.5)
eGFR: 90 mL/min/{1.73_m2} (ref >59)

## 2023-08-29 LAB — CBC WITH DIFFERENTIAL/PLATELET
Basophils Absolute: 0.1 10*3/uL (ref 0.0–0.2)
Basos: 1 %
EOS (ABSOLUTE): 0.2 10*3/uL (ref 0.0–0.4)
Eos: 3 %
Hematocrit: 45.8 % (ref 37.5–51.0)
Hemoglobin: 14.9 g/dL (ref 13.0–17.7)
Immature Grans (Abs): 0.1 10*3/uL (ref 0.0–0.1)
Immature Granulocytes: 1 %
Lymphocytes Absolute: 2 10*3/uL (ref 0.7–3.1)
Lymphs: 36 %
MCH: 33.1 pg — ABNORMAL HIGH (ref 26.6–33.0)
MCHC: 32.5 g/dL (ref 31.5–35.7)
MCV: 102 fL — ABNORMAL HIGH (ref 79–97)
Monocytes Absolute: 0.6 10*3/uL (ref 0.1–0.9)
Monocytes: 11 %
Neutrophils Absolute: 2.7 10*3/uL (ref 1.4–7.0)
Neutrophils: 48 %
Platelets: 358 10*3/uL (ref 150–450)
RBC: 4.5 x10E6/uL (ref 4.14–5.80)
RDW: 12 % (ref 11.6–15.4)
WBC: 5.6 10*3/uL (ref 3.4–10.8)

## 2023-08-29 LAB — LIPID PANEL
Chol/HDL Ratio: 5.2 ratio — ABNORMAL HIGH (ref 0.0–5.0)
Cholesterol, Total: 220 mg/dL — ABNORMAL HIGH (ref 100–199)
HDL: 42 mg/dL (ref >39)
LDL Chol Calc (NIH): 159 mg/dL — ABNORMAL HIGH (ref 0–99)
Triglycerides: 107 mg/dL (ref 0–149)
VLDL Cholesterol Cal: 19 mg/dL (ref 5–40)

## 2023-10-09 ENCOUNTER — Encounter: Payer: Self-pay | Admitting: Emergency Medicine

## 2023-10-09 ENCOUNTER — Ambulatory Visit
Admission: EM | Admit: 2023-10-09 | Discharge: 2023-10-09 | Disposition: A | Discharge status: Home / Self Care | Attending: Family Medicine | Admitting: Family Medicine

## 2023-10-09 DIAGNOSIS — B9689 Other specified bacterial agents as the cause of diseases classified elsewhere: Secondary | ICD-10-CM | POA: Diagnosis not present

## 2023-10-09 DIAGNOSIS — J069 Acute upper respiratory infection, unspecified: Secondary | ICD-10-CM

## 2023-10-09 MED ORDER — PSEUDOEPHEDRINE HCL 30 MG PO TABS: 30.0000 mg | ORAL_TABLET | Freq: Three times a day (TID) | ORAL | 0 refills | Status: AC | PRN

## 2023-10-09 MED ORDER — CETIRIZINE HCL 10 MG PO TABS: 10.0000 mg | ORAL_TABLET | Freq: Every day | ORAL | 0 refills | Status: DC

## 2023-10-09 MED ORDER — AMOXICILLIN 875 MG PO TABS
875.0000 mg | ORAL_TABLET | Freq: Two times a day (BID) | ORAL | 0 refills | Status: AC
Start: 2023-10-09 — End: ?

## 2023-10-09 NOTE — ED Triage Notes (Signed)
 Pt presents c/o sore throat and nasal congestion x 1 week. Pt reports he has not tried any OTC to intervene with sxs. Pt denies emesis but does c/o diarrhea x 2 days.

## 2023-10-09 NOTE — Discharge Instructions (Signed)
 We will manage this as a bacterial upper respiratory infection with amoxicillin  for 7 days. For sore throat or cough try using a honey-based tea. Use 3 teaspoons of honey with juice squeezed from half lemon. Place shaved pieces of ginger into 1/2-1 cup of water and warm over stove top. Then mix the ingredients and repeat every 4 hours as needed. Please take ibuprofen  600mg  every 6 hours with food alternating with OR taken together with Tylenol  500mg -650mg  every 6 hours for throat pain, fevers, aches and pains. Hydrate very well with at least 2 liters of water. Eat light meals such as soups (chicken and noodles, vegetable, chicken and wild rice).  Do not eat foods that you are allergic to.  Taking an antihistamine like Zyrtec  (10mg  daily) can help against postnasal drainage, sinus congestion which can cause sinus pain, sinus headaches, throat pain, painful swallowing, coughing.  You can take this together with pseudoephedrine  (Sudafed) at a dose of 30-60 mg 3 times a day or twice daily as needed for the same kind of nasal drip, congestion.

## 2023-10-09 NOTE — ED Provider Notes (Signed)
 [facility]  Note:  This document was prepared using Conservation officer, historic buildings and may include unintentional dictation errors.  MRN: [id number] DOB: November 09, 1995  Subjective:   Derek Warren is a 28 y.o. male presenting for 1 week history of acute onset persistent and worsening sinus congestion, sinus pressure, sinus pain, throat pain.  Has had a slight cough.  No chest pain, shortness of breath, wheezing, ear pain, ear drainage, fevers, body pains.  No nausea, vomiting, abdominal pain.  He has had loose stools the past couple of days.  No bloody stools.  No recent antibiotic use.  No history of allergies.  No asthma.  Patient does smoke marijuana daily.  No current facility-administered medications for this encounter.  Current Outpatient Medications:    docusate sodium  (COLACE) 100 MG capsule, Take 1 capsule (100 mg total) by mouth daily., Disp: 60 capsule, Rfl: 0   hydrocortisone  (ANUSOL -HC) 25 MG suppository, Place 1 suppository (25 mg total) rectally 2 (two) times daily., Disp: 100 suppository, Rfl: 0   hydrocortisone  cream 1 %, Apply to affected area 2 times daily, Disp: 15 g, Rfl: 0   Hydrocortisone , Perianal, 1 % CREA, Apply 1 Application topically 2 (two) times daily., Disp: , Rfl:    lidocaine  (LIDODERM ) 5 %, Place 1 patch onto the skin daily. Remove & Discard patch within 12 hours or as directed by MD, Disp: 10 patch, Rfl: 0   naproxen  (NAPROSYN ) 375 MG tablet, Take 1 tablet (375 mg total) by mouth 2 (two) times daily., Disp: 20 tablet, Rfl: 0   No Known Allergies  Past Medical History:  Diagnosis Date   STD (sexually transmitted disease)      History reviewed. No pertinent surgical history.  Family History  Problem Relation Age of Onset   Cancer Other     Social History   Tobacco Use   Smoking status: Never    Passive exposure: Never   Smokeless tobacco: Never  Vaping Use   Vaping status: Never Used  Substance Use Topics   Alcohol  use: Yes    Comment: Occassionally   Drug use: Yes    Frequency: 1.0 times per week    Types: Marijuana    ROS   Objective:   Vitals: BP 129/89 (BP Location: Left Arm)   Pulse 80   Temp 98 F (36.7 C) (Oral)   Resp 16   Wt 198 lb 13.7 oz (90.2 kg)   SpO2 94%   BMI 26.60 kg/m   Physical Exam Constitutional:      General: He is not in acute distress.    Appearance: Normal appearance. He is well-developed and normal weight. He is not ill-appearing, toxic-appearing or diaphoretic.  HENT:     Head: Normocephalic and atraumatic.     Right Ear: Tympanic membrane, ear canal and external ear normal. No drainage, swelling or tenderness. No middle ear effusion. There is no impacted cerumen. Tympanic membrane is not erythematous or bulging.     Left Ear: Tympanic membrane, ear canal and external ear normal. No drainage, swelling or tenderness.  No middle ear effusion. There is no impacted cerumen. Tympanic membrane is not erythematous or bulging.     Nose: Congestion present. No rhinorrhea.     Mouth/Throat:     Mouth: Mucous membranes are moist.     Pharynx: Oropharynx is clear. Posterior oropharyngeal erythema (with significant post-nasal drainage overlying pharynx) present. No pharyngeal swelling, oropharyngeal exudate or uvula swelling.     Tonsils:  No tonsillar exudate or tonsillar abscesses. 0 on the right. 0 on the left.  Eyes:     General: No scleral icterus.       Right eye: No discharge.        Left eye: No discharge.     Extraocular Movements: Extraocular movements intact.     Conjunctiva/sclera: Conjunctivae normal.  Cardiovascular:     Rate and Rhythm: Normal rate.  Pulmonary:     Effort: Pulmonary effort is normal.  Musculoskeletal:     Cervical back: Normal range of motion and neck supple. No rigidity. No muscular tenderness.  Neurological:     General: No focal deficit present.     Mental Status: He is alert and oriented to person, place, and time.  Psychiatric:         Mood and Affect: Mood normal.        Behavior: Behavior normal.        Thought Content: Thought content normal.        Judgment: Judgment normal.     Assessment and Plan :   PDMP not reviewed this encounter.  1. Bacterial upper respiratory infection    Will manage her bacterial upper respiratory infection with amoxicillin , supportive care.  Deferred imaging given clear cardiopulmonary exam, hemodynamically stable vital signs.  Counseled patient on potential for adverse effects with medications prescribed/recommended today, ER and return-to-clinic precautions discussed, patient verbalized understanding.    Christopher Savannah, [place] 10/09/23 1432

## 2023-10-29 ENCOUNTER — Ambulatory Visit: Admitting: Family Medicine

## 2023-12-08 ENCOUNTER — Ambulatory Visit: Admitting: Family Medicine

## 2023-12-09 ENCOUNTER — Ambulatory Visit: Admitting: Family Medicine

## 2023-12-18 ENCOUNTER — Encounter: Payer: Self-pay | Admitting: Emergency Medicine

## 2023-12-18 ENCOUNTER — Ambulatory Visit: Admission: EM | Admit: 2023-12-18 | Discharge: 2023-12-18 | Disposition: A | Discharge status: Home / Self Care

## 2023-12-18 DIAGNOSIS — J309 Allergic rhinitis, unspecified: Secondary | ICD-10-CM

## 2023-12-18 MED ORDER — FLUTICASONE PROPIONATE 50 MCG/ACT NA SUSP: 2.0000 | Freq: Every day | NASAL | 0 refills | Status: AC

## 2023-12-18 NOTE — ED Triage Notes (Signed)
 Pt presents c/o nasal drainage x several weeks. Pt states,  I have been dealing with this issue since August. Like, I fel fine but I can't get over this runny nose. I didn't take one of the RX proscribed because I was a little scared  Pt denies any additional sxs.

## 2023-12-18 NOTE — ED Provider Notes (Signed)
 [name]    [name]: [id number] Arrival date & time: 12/18/23  1325      History   Chief Complaint Chief Complaint  Patient presents with   Nasal Drainage     HPI Orrie Schubert is a 28 y.o. male.   Patient presents today due to nasal congestion, nasal drainage, occasional sneezing, occasional coughing, and occasional  itching that started in [place].  Patient states that when he was seen in August he was treated for a sinus infection and he took all medication except pseudoephedrine .  Patient states that his symptoms have not resolved.  Patient denies that he feels bad, just has nasal congestion and occasional fullness in the ears when he blows his nose.     Past Medical History:  Diagnosis Date   STD (sexually transmitted disease)     Patient Active Problem List   Diagnosis Date Noted   External hemorrhoids 11/07/2022   Cervical adenopathy 04/01/2021    History reviewed. No pertinent surgical history.     Home Medications    Prior to Admission medications   Medication Sig Start Date End Date Taking? Authorizing Provider  fexofenadine (ALLEGRA ALLERGY) 180 MG tablet Take 1 tablet (180 mg total) by mouth daily. 12/18/23  Yes Andra Krabbe C, PA-C  fluticasone (FLONASE) 50 MCG/ACT nasal spray Place 2 sprays into both nostrils daily. 12/18/23  Yes Andra Krabbe BROCKS, PA-C  amoxicillin  (AMOXIL ) 875 MG tablet Take 1 tablet (875 mg total) by mouth 2 (two) times daily. 10/09/23   Christopher Savannah, PA-C  docusate sodium  (COLACE) 100 MG capsule Take 1 capsule (100 mg total) by mouth daily. 07/11/22   Enedelia Dorna HERO, FNP  hydrocortisone  (ANUSOL -HC) 25 MG suppository Place 1 suppository (25 mg total) rectally 2 (two) times daily. 09/16/22   Arloa Suzen RAMAN, NP  hydrocortisone  cream 1 % Apply to affected area 2 times daily 07/30/22   Enedelia Dorna HERO, FNP  Hydrocortisone , Perianal, 1 % CREA Apply 1 Application topically 2 (two) times daily. 07/30/22    [provider]  lidocaine  (LIDODERM ) 5 % Place 1 patch onto the skin daily. Remove & Discard patch within 12 hours or as directed by MD 08/10/23   Raspet, Rocky POUR, PA-C  naproxen  (NAPROSYN ) 375 MG tablet Take 1 tablet (375 mg total) by mouth 2 (two) times daily. 08/10/23   Raspet, Erin K, PA-C  pseudoephedrine  (SUDAFED) 30 MG tablet Take 1 tablet (30 mg total) by mouth every 8 (eight) hours as needed for congestion. 10/09/23   Christopher Savannah, PA-C    Family History Family History  Problem Relation Age of Onset   Cancer Other     Social History Social History   Tobacco Use   Smoking status: Never    Passive exposure: Never   Smokeless tobacco: Never  Vaping Use   Vaping status: Never Used  Substance Use Topics   Alcohol use: Yes    Comment: Occassionally   Drug use: Yes    Frequency: 1.0 times per week    Types: Marijuana     Allergies   Patient has no known allergies.   Review of Systems Review of Systems   Physical Exam Triage Vital Signs ED Triage Vitals [12/18/23 1449]  Encounter Vitals Group     BP (!) 150/92     Girls Systolic BP Percentile      Girls Diastolic BP Percentile      Boys Systolic BP Percentile      Boys Diastolic BP Percentile  Pulse Rate 70     Resp 16     Temp 97.8 F (36.6 C)     Temp Source Oral     SpO2 98 %     Weight 198 lb 13.7 oz (90.2 kg)     Height      Head Circumference      Peak Flow      Pain Score 0     Pain Loc      Pain Education      Exclude from Growth Chart    No data found.  Updated Vital Signs BP (!) 150/92 (BP Location: Right Arm)   Pulse 70   Temp 97.8 F (36.6 C) (Oral)   Resp 16   Wt 198 lb 13.7 oz (90.2 kg)   SpO2 98%   BMI 26.60 kg/m   Visual Acuity Right Eye Distance:   Left Eye Distance:   Bilateral Distance:    Right Eye Near:   Left Eye Near:    Bilateral Near:     Physical Exam Vitals and nursing note reviewed.  Constitutional:      General: He is not in acute distress.     Appearance: Normal appearance. He is not ill-appearing, toxic-appearing or diaphoretic.  HENT:     Right Ear: Tympanic membrane, ear canal and external ear normal.     Left Ear: There is impacted cerumen.     Nose: Congestion (markedly enlarged turbinates bilaterally) present. No rhinorrhea.     Mouth/Throat:     Mouth: Mucous membranes are moist.     Pharynx: Oropharynx is clear. No oropharyngeal exudate or posterior oropharyngeal erythema.  Eyes:     General: No scleral icterus. Cardiovascular:     Rate and Rhythm: Normal rate and regular rhythm.     Heart sounds: Normal heart sounds.  Pulmonary:     Effort: Pulmonary effort is normal. No respiratory distress.     Breath sounds: Normal breath sounds. No wheezing or rhonchi.  Skin:    General: Skin is warm.  Neurological:     Mental Status: He is alert and oriented to person, place, and time.  Psychiatric:        Mood and Affect: Mood normal.        Behavior: Behavior normal.      UC Treatments / Results  Labs (all labs ordered are listed, but only abnormal results are displayed) Labs Reviewed - No data to display  EKG   Radiology No results found.  Procedures Procedures (including critical care time)  Medications Ordered in UC Medications - No data to display  Initial Impression / Assessment and Plan / UC Course  I have reviewed the triage vital signs and the nursing notes.  Pertinent labs & imaging results that were available during my care of the patient were reviewed by me and considered in my medical decision making (see chart for details).     Allergic rhinitis-patient was prescribed Allegra 20 mg to be taken daily and Flonase nasal spray.  Patient was given medication for 1 month.  Patient advised to follow-up with primary care doctor for refills if his symptoms improve. Final Clinical Impressions(s) / UC Diagnoses   Final diagnoses:  Allergic rhinitis, unspecified seasonality, unspecified trigger    Discharge Instructions   None    ED Prescriptions     Medication Sig Dispense Auth. Provider   fluticasone (FLONASE) 50 MCG/ACT nasal spray Place 2 sprays into both nostrils daily. 16 g Andra Corean BROCKS,  PA-C   fexofenadine (ALLEGRA ALLERGY) 180 MG tablet Take 1 tablet (180 mg total) by mouth daily. 30 tablet Andra Corean BROCKS, PA-C      PDMP not reviewed this encounter.   Andra Corean BROCKS, PA-C 12/18/23 1538

## 2023-12-29 ENCOUNTER — Ambulatory Visit: Admitting: Family Medicine

## 2023-12-30 ENCOUNTER — Telehealth: Payer: Self-pay | Admitting: Family Medicine

## 2023-12-30 NOTE — Telephone Encounter (Signed)
 Ok to dismiss

## 2023-12-30 NOTE — Telephone Encounter (Signed)
 Patient has late canceled/no showed 4 appointments since 03/27/18 which qualifies for dismissal. Please advise, thanks

## 2023-12-31 ENCOUNTER — Encounter: Payer: Self-pay | Admitting: Family Medicine

## 2024-03-07 ENCOUNTER — Ambulatory Visit: Admission: EM | Admit: 2024-03-07 | Discharge: 2024-03-07 | Disposition: A | Discharge status: Home / Self Care

## 2024-03-07 ENCOUNTER — Encounter: Payer: Self-pay | Admitting: *Deleted

## 2024-03-07 DIAGNOSIS — Z202 Contact with and (suspected) exposure to infections with a predominantly sexual mode of transmission: Secondary | ICD-10-CM | POA: Diagnosis not present

## 2024-03-07 NOTE — ED Provider Notes (Signed)
 " [name]    [name]: [id number] Arrival date & time: 03/07/24  1445      History   Chief Complaint Chief Complaint  Patient presents with   SEXUALLY TRANSMITTED DISEASE    HPI Derek Warren is a 29 y.o. male.   Pt presents today for STI testing and denies any symptoms. Pt is requesting blood work as well.   The history is provided by the patient.    Past Medical History:  Diagnosis Date   STD (sexually transmitted disease)     Patient Active Problem List   Diagnosis Date Noted   External hemorrhoids 11/07/2022   Cervical adenopathy 04/01/2021    History reviewed. No pertinent surgical history.     Home Medications    Prior to Admission medications  Medication Sig Start Date End Date Taking? Authorizing Provider  amoxicillin  (AMOXIL ) 875 MG tablet Take 1 tablet (875 mg total) by mouth 2 (two) times daily. 10/09/23   Christopher Savannah, PA-C  docusate sodium  (COLACE) 100 MG capsule Take 1 capsule (100 mg total) by mouth daily. 07/11/22   Enedelia Dorna HERO, FNP  fexofenadine (ALLEGRA ALLERGY) 180 MG tablet Take 1 tablet (180 mg total) by mouth daily. 12/18/23   Cher Franzoni C, PA-C  fluticasone  (FLONASE ) 50 MCG/ACT nasal spray Place 2 sprays into both nostrils daily. 12/18/23   Andra Corean BROCKS, PA-C  hydrocortisone  (ANUSOL -HC) 25 MG suppository Place 1 suppository (25 mg total) rectally 2 (two) times daily. 09/16/22   Arloa Suzen RAMAN, NP  hydrocortisone  cream 1 % Apply to affected area 2 times daily 07/30/22   Enedelia Dorna HERO, FNP  Hydrocortisone , Perianal, 1 % CREA Apply 1 Application topically 2 (two) times daily. 07/30/22   [provider]  lidocaine  (LIDODERM ) 5 % Place 1 patch onto the skin daily. Remove & Discard patch within 12 hours or as directed by MD 08/10/23   Raspet, Rocky POUR, PA-C  naproxen  (NAPROSYN ) 375 MG tablet Take 1 tablet (375 mg total) by mouth 2 (two) times daily. 08/10/23   Raspet, Erin K, PA-C  pseudoephedrine   (SUDAFED) 30 MG tablet Take 1 tablet (30 mg total) by mouth every 8 (eight) hours as needed for congestion. 10/09/23   Christopher Savannah, PA-C    Family History Family History  Problem Relation Age of Onset   Cancer Other     Social History Social History[1]   Allergies   Patient has no known allergies.   Review of Systems Review of Systems   Physical Exam Triage Vital Signs ED Triage Vitals  Encounter Vitals Group     BP 03/07/24 1456 118/77     Girls Systolic BP Percentile --      Girls Diastolic BP Percentile --      Boys Systolic BP Percentile --      Boys Diastolic BP Percentile --      Pulse Rate 03/07/24 1456 69     Resp 03/07/24 1456 18     Temp 03/07/24 1456 98.2 F (36.8 C)     Temp Source 03/07/24 1456 Oral     SpO2 03/07/24 1456 98 %     Weight --      Height --      Head Circumference --      Peak Flow --      Pain Score 03/07/24 1455 0     Pain Loc --      Pain Education --      Exclude from Growth Chart --  No data found.  Updated Vital Signs BP 118/77 (BP Location: Left Arm)   Pulse 69   Temp 98.2 F (36.8 C) (Oral)   Resp 18   SpO2 98%   Visual Acuity Right Eye Distance:   Left Eye Distance:   Bilateral Distance:    Right Eye Near:   Left Eye Near:    Bilateral Near:     Physical Exam Vitals and nursing note reviewed.  Constitutional:      General: He is not in acute distress.    Appearance: Normal appearance. He is not ill-appearing, toxic-appearing or diaphoretic.  Eyes:     General: No scleral icterus. Cardiovascular:     Rate and Rhythm: Normal rate and regular rhythm.     Heart sounds: Normal heart sounds.  Pulmonary:     Effort: Pulmonary effort is normal. No respiratory distress.     Breath sounds: Normal breath sounds. No wheezing or rhonchi.  Skin:    General: Skin is warm.  Neurological:     Mental Status: He is alert and oriented to person, place, and time.  Psychiatric:        Mood and Affect: Mood normal.         Behavior: Behavior normal.      UC Treatments / Results  Labs (all labs ordered are listed, but only abnormal results are displayed) Labs Reviewed  HIV ANTIBODY (ROUTINE TESTING W REFLEX)  SYPHILIS: RPR W/REFLEX TO RPR TITER AND TREPONEMAL ANTIBODIES, TRADITIONAL SCREENING AND DIAGNOSIS ALGORITHM  CYTOLOGY, (ORAL, ANAL, URETHRAL) ANCILLARY ONLY    EKG   Radiology No results found.  Procedures Procedures (including critical care time)  Medications Ordered in UC Medications - No data to display  Initial Impression / Assessment and Plan / UC Course  I have reviewed the triage vital signs and the nursing notes.  Pertinent labs & imaging results that were available during my care of the patient were reviewed by me and considered in my medical decision making (see chart for details).     Final Clinical Impressions(s) / UC Diagnoses   Final diagnoses:  Contact with and (suspected) exposure to infections with a predominantly sexual mode of transmission   Discharge Instructions   None    ED Prescriptions   None    PDMP not reviewed this encounter.    [1]  Social History Tobacco Use   Smoking status: Never    Passive exposure: Never   Smokeless tobacco: Never  Vaping Use   Vaping status: Never Used  Substance Use Topics   Alcohol use: Yes    Comment: Occassionally   Drug use: Yes    Frequency: 1.0 times per week    Types: Marijuana     Andra Corean BROCKS, PA-C 03/07/24 1537  "

## 2024-03-07 NOTE — ED Triage Notes (Signed)
 Pt requesting STI testing- denies symptoms

## 2024-03-08 LAB — CYTOLOGY, (ORAL, ANAL, URETHRAL) ANCILLARY ONLY
Chlamydia: NEGATIVE
Comment: NEGATIVE
Comment: NEGATIVE
Comment: NORMAL
Neisseria Gonorrhea: NEGATIVE
Trichomonas: NEGATIVE

## 2024-03-08 LAB — HIV ANTIBODY (ROUTINE TESTING W REFLEX): HIV Screen 4th Generation wRfx: NONREACTIVE

## 2024-03-08 LAB — SYPHILIS: RPR W/REFLEX TO RPR TITER AND TREPONEMAL ANTIBODIES, TRADITIONAL SCREENING AND DIAGNOSIS ALGORITHM: RPR Ser Ql: NONREACTIVE
